# Patient Record
Sex: Female | Born: 1945
Health system: Southern US, Community
[De-identification: ages and names within clinical notes are randomized; demographics above are authoritative.]

## PROBLEM LIST (undated history)

## (undated) DIAGNOSIS — C801 Malignant (primary) neoplasm, unspecified: Secondary | ICD-10-CM

## (undated) DIAGNOSIS — R7303 Prediabetes: Secondary | ICD-10-CM

## (undated) DIAGNOSIS — F419 Anxiety disorder, unspecified: Secondary | ICD-10-CM

## (undated) DIAGNOSIS — E786 Lipoprotein deficiency: Secondary | ICD-10-CM

## (undated) DIAGNOSIS — M1990 Unspecified osteoarthritis, unspecified site: Secondary | ICD-10-CM

## (undated) HISTORY — PX: COLONOSCOPY: SHX174

## (undated) HISTORY — PX: TONSILLECTOMY: SUR1361

---

## 1995-09-17 HISTORY — PX: SHOULDER ARTHROSCOPY: SHX128

## 2001-11-03 ENCOUNTER — Encounter: Admission: RE | Admit: 2001-11-03 | Discharge: 2001-11-03 | Payer: Self-pay | Admitting: Geriatric Medicine

## 2001-11-03 ENCOUNTER — Encounter: Payer: Self-pay | Admitting: Geriatric Medicine

## 2004-01-06 ENCOUNTER — Other Ambulatory Visit: Admission: RE | Admit: 2004-01-06 | Discharge: 2004-01-06 | Payer: Self-pay | Admitting: Geriatric Medicine

## 2006-02-03 ENCOUNTER — Other Ambulatory Visit: Admission: RE | Admit: 2006-02-03 | Discharge: 2006-02-03 | Payer: Self-pay | Admitting: Geriatric Medicine

## 2006-10-23 ENCOUNTER — Encounter: Admission: RE | Admit: 2006-10-23 | Discharge: 2006-10-23 | Payer: Self-pay | Admitting: Orthopedic Surgery

## 2008-02-17 ENCOUNTER — Other Ambulatory Visit: Admission: RE | Admit: 2008-02-17 | Discharge: 2008-02-17 | Payer: Self-pay | Admitting: Geriatric Medicine

## 2009-06-06 ENCOUNTER — Observation Stay (HOSPITAL_COMMUNITY): Admission: EM | Admit: 2009-06-06 | Discharge: 2009-06-07 | Payer: Self-pay | Admitting: Emergency Medicine

## 2010-03-07 ENCOUNTER — Other Ambulatory Visit: Admission: RE | Admit: 2010-03-07 | Discharge: 2010-03-07 | Payer: Self-pay | Admitting: Geriatric Medicine

## 2010-12-21 LAB — CBC
HCT: 36 % (ref 36.0–46.0)
Hemoglobin: 12.2 g/dL (ref 12.0–15.0)
MCHC: 33.6 g/dL (ref 30.0–36.0)
MCHC: 33.9 g/dL (ref 30.0–36.0)
MCHC: 34 g/dL (ref 30.0–36.0)
MCV: 90.5 fL (ref 78.0–100.0)
MCV: 91.2 fL (ref 78.0–100.0)
Platelets: 276 10*3/uL (ref 150–400)
Platelets: 278 10*3/uL (ref 150–400)
Platelets: 283 10*3/uL (ref 150–400)
RBC: 3.98 MIL/uL (ref 3.87–5.11)
RDW: 13.2 % (ref 11.5–15.5)
RDW: 13.4 % (ref 11.5–15.5)
WBC: 7.2 10*3/uL (ref 4.0–10.5)
WBC: 9.2 10*3/uL (ref 4.0–10.5)

## 2010-12-21 LAB — DIFFERENTIAL
Basophils Absolute: 0 10*3/uL (ref 0.0–0.1)
Basophils Relative: 1 % (ref 0–1)
Eosinophils Absolute: 0.2 10*3/uL (ref 0.0–0.7)
Eosinophils Relative: 0 % (ref 0–5)
Eosinophils Relative: 3 % (ref 0–5)
Lymphocytes Relative: 11 % — ABNORMAL LOW (ref 12–46)
Lymphocytes Relative: 29 % (ref 12–46)
Lymphs Abs: 1 10*3/uL (ref 0.7–4.0)
Lymphs Abs: 2.1 10*3/uL (ref 0.7–4.0)
Monocytes Absolute: 0 10*3/uL — ABNORMAL LOW (ref 0.1–1.0)
Monocytes Absolute: 0.7 10*3/uL (ref 0.1–1.0)
Monocytes Relative: 9 % (ref 3–12)
Neutro Abs: 4.2 10*3/uL (ref 1.7–7.7)
Neutrophils Relative %: 58 % (ref 43–77)

## 2010-12-21 LAB — COMPREHENSIVE METABOLIC PANEL
ALT: 17 U/L (ref 0–35)
ALT: 19 U/L (ref 0–35)
AST: 22 U/L (ref 0–37)
AST: 24 U/L (ref 0–37)
Albumin: 3.6 g/dL (ref 3.5–5.2)
Albumin: 3.9 g/dL (ref 3.5–5.2)
Calcium: 8.8 mg/dL (ref 8.4–10.5)
Calcium: 9 mg/dL (ref 8.4–10.5)
Chloride: 109 mEq/L (ref 96–112)
Creatinine, Ser: 0.73 mg/dL (ref 0.4–1.2)
GFR calc Af Amer: >60 mL/min (ref >60)
GFR calc Af Amer: >60 mL/min (ref >60)
Glucose, Bld: 112 mg/dL — ABNORMAL HIGH (ref 70–99)
Potassium: 4.2 mEq/L (ref 3.5–5.1)
Sodium: 138 mEq/L (ref 135–145)
Sodium: 140 mEq/L (ref 135–145)
Total Protein: 6.7 g/dL (ref 6.0–8.3)

## 2010-12-21 LAB — PROTIME-INR
INR: 0.9 (ref 0.00–1.49)
Prothrombin Time: 11.8 seconds (ref 11.6–15.2)

## 2010-12-21 LAB — POCT I-STAT, CHEM 8
Calcium, Ion: 1.14 mmol/L (ref 1.12–1.32)
Creatinine, Ser: 0.7 mg/dL (ref 0.4–1.2)
Glucose, Bld: 112 mg/dL — ABNORMAL HIGH (ref 70–99)
HCT: 37 % (ref 36.0–46.0)
Hemoglobin: 12.6 g/dL (ref 12.0–15.0)
TCO2: 22 mmol/L (ref 0–100)

## 2010-12-21 LAB — DIC (DISSEMINATED INTRAVASCULAR COAGULATION)PANEL
D-Dimer, Quant: 0.85 ug/mL-FEU — ABNORMAL HIGH (ref 0.00–0.48)
Fibrinogen: 390 mg/dL (ref 204–475)
Platelets: 282 10*3/uL (ref 150–400)
aPTT: 27 seconds (ref 24–37)

## 2012-03-13 ENCOUNTER — Other Ambulatory Visit: Payer: Self-pay | Admitting: Geriatric Medicine

## 2012-03-13 ENCOUNTER — Other Ambulatory Visit (HOSPITAL_COMMUNITY)
Admission: RE | Admit: 2012-03-13 | Discharge: 2012-03-13 | Disposition: A | Discharge status: Home / Self Care | Payer: Medicare Other | Source: Ambulatory Visit | Attending: Geriatric Medicine | Admitting: Geriatric Medicine

## 2012-03-13 DIAGNOSIS — Z124 Encounter for screening for malignant neoplasm of cervix: Secondary | ICD-10-CM | POA: Insufficient documentation

## 2012-03-13 DIAGNOSIS — Z79899 Other long term (current) drug therapy: Secondary | ICD-10-CM | POA: Diagnosis not present

## 2012-03-13 DIAGNOSIS — F411 Generalized anxiety disorder: Secondary | ICD-10-CM | POA: Diagnosis not present

## 2012-03-13 DIAGNOSIS — E7800 Pure hypercholesterolemia, unspecified: Secondary | ICD-10-CM | POA: Diagnosis not present

## 2012-03-13 DIAGNOSIS — Z1331 Encounter for screening for depression: Secondary | ICD-10-CM | POA: Diagnosis not present

## 2012-04-03 DIAGNOSIS — Z1231 Encounter for screening mammogram for malignant neoplasm of breast: Secondary | ICD-10-CM | POA: Diagnosis not present

## 2012-07-16 DIAGNOSIS — IMO0002 Reserved for concepts with insufficient information to code with codable children: Secondary | ICD-10-CM | POA: Diagnosis not present

## 2012-07-16 DIAGNOSIS — M23329 Other meniscus derangements, posterior horn of medial meniscus, unspecified knee: Secondary | ICD-10-CM | POA: Diagnosis not present

## 2012-08-05 DIAGNOSIS — M23329 Other meniscus derangements, posterior horn of medial meniscus, unspecified knee: Secondary | ICD-10-CM | POA: Diagnosis not present

## 2012-08-05 DIAGNOSIS — IMO0002 Reserved for concepts with insufficient information to code with codable children: Secondary | ICD-10-CM | POA: Diagnosis not present

## 2012-08-10 DIAGNOSIS — H259 Unspecified age-related cataract: Secondary | ICD-10-CM | POA: Diagnosis not present

## 2012-08-10 DIAGNOSIS — H40249 Residual stage of angle-closure glaucoma, unspecified eye: Secondary | ICD-10-CM | POA: Diagnosis not present

## 2012-08-10 DIAGNOSIS — H43819 Vitreous degeneration, unspecified eye: Secondary | ICD-10-CM | POA: Diagnosis not present

## 2012-08-10 DIAGNOSIS — H524 Presbyopia: Secondary | ICD-10-CM | POA: Diagnosis not present

## 2013-03-26 DIAGNOSIS — Z1331 Encounter for screening for depression: Secondary | ICD-10-CM | POA: Diagnosis not present

## 2013-03-26 DIAGNOSIS — Z0000 Encounter for general adult medical examination without abnormal findings: Secondary | ICD-10-CM | POA: Diagnosis not present

## 2013-03-26 DIAGNOSIS — E7800 Pure hypercholesterolemia, unspecified: Secondary | ICD-10-CM | POA: Diagnosis not present

## 2013-03-26 DIAGNOSIS — N951 Menopausal and female climacteric states: Secondary | ICD-10-CM | POA: Diagnosis not present

## 2013-03-26 DIAGNOSIS — R071 Chest pain on breathing: Secondary | ICD-10-CM | POA: Diagnosis not present

## 2013-03-26 DIAGNOSIS — Z79899 Other long term (current) drug therapy: Secondary | ICD-10-CM | POA: Diagnosis not present

## 2013-04-12 DIAGNOSIS — Z1231 Encounter for screening mammogram for malignant neoplasm of breast: Secondary | ICD-10-CM | POA: Diagnosis not present

## 2013-04-28 DIAGNOSIS — M899 Disorder of bone, unspecified: Secondary | ICD-10-CM | POA: Diagnosis not present

## 2013-04-28 DIAGNOSIS — N951 Menopausal and female climacteric states: Secondary | ICD-10-CM | POA: Diagnosis not present

## 2013-05-04 DIAGNOSIS — F411 Generalized anxiety disorder: Secondary | ICD-10-CM | POA: Diagnosis not present

## 2013-05-04 DIAGNOSIS — R071 Chest pain on breathing: Secondary | ICD-10-CM | POA: Diagnosis not present

## 2013-05-04 DIAGNOSIS — M899 Disorder of bone, unspecified: Secondary | ICD-10-CM | POA: Diagnosis not present

## 2013-06-11 DIAGNOSIS — B079 Viral wart, unspecified: Secondary | ICD-10-CM | POA: Diagnosis not present

## 2013-06-11 DIAGNOSIS — L909 Atrophic disorder of skin, unspecified: Secondary | ICD-10-CM | POA: Diagnosis not present

## 2013-06-11 DIAGNOSIS — L821 Other seborrheic keratosis: Secondary | ICD-10-CM | POA: Diagnosis not present

## 2013-08-27 DIAGNOSIS — H43819 Vitreous degeneration, unspecified eye: Secondary | ICD-10-CM | POA: Diagnosis not present

## 2013-08-27 DIAGNOSIS — H40249 Residual stage of angle-closure glaucoma, unspecified eye: Secondary | ICD-10-CM | POA: Diagnosis not present

## 2013-08-27 DIAGNOSIS — H524 Presbyopia: Secondary | ICD-10-CM | POA: Diagnosis not present

## 2013-08-27 DIAGNOSIS — H25019 Cortical age-related cataract, unspecified eye: Secondary | ICD-10-CM | POA: Diagnosis not present

## 2014-04-01 DIAGNOSIS — R21 Rash and other nonspecific skin eruption: Secondary | ICD-10-CM | POA: Diagnosis not present

## 2014-04-01 DIAGNOSIS — Z0000 Encounter for general adult medical examination without abnormal findings: Secondary | ICD-10-CM | POA: Diagnosis not present

## 2014-04-01 DIAGNOSIS — Z79899 Other long term (current) drug therapy: Secondary | ICD-10-CM | POA: Diagnosis not present

## 2014-04-01 DIAGNOSIS — Z1331 Encounter for screening for depression: Secondary | ICD-10-CM | POA: Diagnosis not present

## 2014-04-01 DIAGNOSIS — M25559 Pain in unspecified hip: Secondary | ICD-10-CM | POA: Diagnosis not present

## 2014-04-01 DIAGNOSIS — F411 Generalized anxiety disorder: Secondary | ICD-10-CM | POA: Diagnosis not present

## 2014-04-01 DIAGNOSIS — Z23 Encounter for immunization: Secondary | ICD-10-CM | POA: Diagnosis not present

## 2014-04-01 DIAGNOSIS — E7800 Pure hypercholesterolemia, unspecified: Secondary | ICD-10-CM | POA: Diagnosis not present

## 2014-04-15 DIAGNOSIS — Z1231 Encounter for screening mammogram for malignant neoplasm of breast: Secondary | ICD-10-CM | POA: Diagnosis not present

## 2014-06-13 DIAGNOSIS — C44319 Basal cell carcinoma of skin of other parts of face: Secondary | ICD-10-CM | POA: Diagnosis not present

## 2014-06-13 DIAGNOSIS — L819 Disorder of pigmentation, unspecified: Secondary | ICD-10-CM | POA: Diagnosis not present

## 2014-06-13 DIAGNOSIS — L821 Other seborrheic keratosis: Secondary | ICD-10-CM | POA: Diagnosis not present

## 2014-06-22 DIAGNOSIS — C44319 Basal cell carcinoma of skin of other parts of face: Secondary | ICD-10-CM | POA: Diagnosis not present

## 2014-07-04 DIAGNOSIS — Z79899 Other long term (current) drug therapy: Secondary | ICD-10-CM | POA: Diagnosis not present

## 2014-07-04 DIAGNOSIS — E780 Pure hypercholesterolemia: Secondary | ICD-10-CM | POA: Diagnosis not present

## 2014-09-01 DIAGNOSIS — H52203 Unspecified astigmatism, bilateral: Secondary | ICD-10-CM | POA: Diagnosis not present

## 2014-09-01 DIAGNOSIS — H04123 Dry eye syndrome of bilateral lacrimal glands: Secondary | ICD-10-CM | POA: Diagnosis not present

## 2014-09-01 DIAGNOSIS — H43813 Vitreous degeneration, bilateral: Secondary | ICD-10-CM | POA: Diagnosis not present

## 2014-09-01 DIAGNOSIS — H2513 Age-related nuclear cataract, bilateral: Secondary | ICD-10-CM | POA: Diagnosis not present

## 2014-11-28 DIAGNOSIS — M25561 Pain in right knee: Secondary | ICD-10-CM | POA: Diagnosis not present

## 2014-12-01 DIAGNOSIS — M79651 Pain in right thigh: Secondary | ICD-10-CM | POA: Diagnosis not present

## 2014-12-01 DIAGNOSIS — M25661 Stiffness of right knee, not elsewhere classified: Secondary | ICD-10-CM | POA: Diagnosis not present

## 2014-12-01 DIAGNOSIS — M25561 Pain in right knee: Secondary | ICD-10-CM | POA: Diagnosis not present

## 2014-12-01 DIAGNOSIS — M25461 Effusion, right knee: Secondary | ICD-10-CM | POA: Diagnosis not present

## 2014-12-06 DIAGNOSIS — M25561 Pain in right knee: Secondary | ICD-10-CM | POA: Diagnosis not present

## 2014-12-06 DIAGNOSIS — M79651 Pain in right thigh: Secondary | ICD-10-CM | POA: Diagnosis not present

## 2014-12-06 DIAGNOSIS — M25461 Effusion, right knee: Secondary | ICD-10-CM | POA: Diagnosis not present

## 2014-12-06 DIAGNOSIS — M25661 Stiffness of right knee, not elsewhere classified: Secondary | ICD-10-CM | POA: Diagnosis not present

## 2014-12-08 DIAGNOSIS — M25461 Effusion, right knee: Secondary | ICD-10-CM | POA: Diagnosis not present

## 2014-12-08 DIAGNOSIS — M79651 Pain in right thigh: Secondary | ICD-10-CM | POA: Diagnosis not present

## 2014-12-08 DIAGNOSIS — M25561 Pain in right knee: Secondary | ICD-10-CM | POA: Diagnosis not present

## 2014-12-08 DIAGNOSIS — M25661 Stiffness of right knee, not elsewhere classified: Secondary | ICD-10-CM | POA: Diagnosis not present

## 2014-12-09 DIAGNOSIS — M79651 Pain in right thigh: Secondary | ICD-10-CM | POA: Diagnosis not present

## 2014-12-09 DIAGNOSIS — M25661 Stiffness of right knee, not elsewhere classified: Secondary | ICD-10-CM | POA: Diagnosis not present

## 2014-12-09 DIAGNOSIS — M25561 Pain in right knee: Secondary | ICD-10-CM | POA: Diagnosis not present

## 2014-12-09 DIAGNOSIS — M25461 Effusion, right knee: Secondary | ICD-10-CM | POA: Diagnosis not present

## 2014-12-16 DIAGNOSIS — M25561 Pain in right knee: Secondary | ICD-10-CM | POA: Diagnosis not present

## 2014-12-16 DIAGNOSIS — M25461 Effusion, right knee: Secondary | ICD-10-CM | POA: Diagnosis not present

## 2014-12-16 DIAGNOSIS — M79651 Pain in right thigh: Secondary | ICD-10-CM | POA: Diagnosis not present

## 2014-12-16 DIAGNOSIS — M25661 Stiffness of right knee, not elsewhere classified: Secondary | ICD-10-CM | POA: Diagnosis not present

## 2014-12-20 DIAGNOSIS — M79651 Pain in right thigh: Secondary | ICD-10-CM | POA: Diagnosis not present

## 2014-12-20 DIAGNOSIS — M25561 Pain in right knee: Secondary | ICD-10-CM | POA: Diagnosis not present

## 2014-12-20 DIAGNOSIS — M25461 Effusion, right knee: Secondary | ICD-10-CM | POA: Diagnosis not present

## 2014-12-20 DIAGNOSIS — M25661 Stiffness of right knee, not elsewhere classified: Secondary | ICD-10-CM | POA: Diagnosis not present

## 2014-12-22 DIAGNOSIS — M79651 Pain in right thigh: Secondary | ICD-10-CM | POA: Diagnosis not present

## 2014-12-22 DIAGNOSIS — M25561 Pain in right knee: Secondary | ICD-10-CM | POA: Diagnosis not present

## 2014-12-22 DIAGNOSIS — M25461 Effusion, right knee: Secondary | ICD-10-CM | POA: Diagnosis not present

## 2014-12-22 DIAGNOSIS — M25661 Stiffness of right knee, not elsewhere classified: Secondary | ICD-10-CM | POA: Diagnosis not present

## 2014-12-23 DIAGNOSIS — M25561 Pain in right knee: Secondary | ICD-10-CM | POA: Diagnosis not present

## 2014-12-23 DIAGNOSIS — M79651 Pain in right thigh: Secondary | ICD-10-CM | POA: Diagnosis not present

## 2014-12-23 DIAGNOSIS — M25661 Stiffness of right knee, not elsewhere classified: Secondary | ICD-10-CM | POA: Diagnosis not present

## 2014-12-23 DIAGNOSIS — M25461 Effusion, right knee: Secondary | ICD-10-CM | POA: Diagnosis not present

## 2014-12-27 DIAGNOSIS — M25461 Effusion, right knee: Secondary | ICD-10-CM | POA: Diagnosis not present

## 2014-12-27 DIAGNOSIS — M79651 Pain in right thigh: Secondary | ICD-10-CM | POA: Diagnosis not present

## 2014-12-27 DIAGNOSIS — M25661 Stiffness of right knee, not elsewhere classified: Secondary | ICD-10-CM | POA: Diagnosis not present

## 2014-12-27 DIAGNOSIS — M25561 Pain in right knee: Secondary | ICD-10-CM | POA: Diagnosis not present

## 2014-12-29 DIAGNOSIS — M25461 Effusion, right knee: Secondary | ICD-10-CM | POA: Diagnosis not present

## 2014-12-29 DIAGNOSIS — M25661 Stiffness of right knee, not elsewhere classified: Secondary | ICD-10-CM | POA: Diagnosis not present

## 2014-12-29 DIAGNOSIS — M79651 Pain in right thigh: Secondary | ICD-10-CM | POA: Diagnosis not present

## 2014-12-29 DIAGNOSIS — M25561 Pain in right knee: Secondary | ICD-10-CM | POA: Diagnosis not present

## 2014-12-30 DIAGNOSIS — M25661 Stiffness of right knee, not elsewhere classified: Secondary | ICD-10-CM | POA: Diagnosis not present

## 2014-12-30 DIAGNOSIS — M79651 Pain in right thigh: Secondary | ICD-10-CM | POA: Diagnosis not present

## 2014-12-30 DIAGNOSIS — M25561 Pain in right knee: Secondary | ICD-10-CM | POA: Diagnosis not present

## 2014-12-30 DIAGNOSIS — M25461 Effusion, right knee: Secondary | ICD-10-CM | POA: Diagnosis not present

## 2015-03-13 DIAGNOSIS — M25561 Pain in right knee: Secondary | ICD-10-CM | POA: Diagnosis not present

## 2015-04-03 DIAGNOSIS — Z1389 Encounter for screening for other disorder: Secondary | ICD-10-CM | POA: Diagnosis not present

## 2015-04-03 DIAGNOSIS — M8580 Other specified disorders of bone density and structure, unspecified site: Secondary | ICD-10-CM | POA: Diagnosis not present

## 2015-04-03 DIAGNOSIS — Z0000 Encounter for general adult medical examination without abnormal findings: Secondary | ICD-10-CM | POA: Diagnosis not present

## 2015-04-03 DIAGNOSIS — E559 Vitamin D deficiency, unspecified: Secondary | ICD-10-CM | POA: Diagnosis not present

## 2015-04-03 DIAGNOSIS — E780 Pure hypercholesterolemia: Secondary | ICD-10-CM | POA: Diagnosis not present

## 2015-04-03 DIAGNOSIS — Z79899 Other long term (current) drug therapy: Secondary | ICD-10-CM | POA: Diagnosis not present

## 2015-04-03 DIAGNOSIS — F410 Panic disorder [episodic paroxysmal anxiety] without agoraphobia: Secondary | ICD-10-CM | POA: Diagnosis not present

## 2015-04-14 DIAGNOSIS — H25813 Combined forms of age-related cataract, bilateral: Secondary | ICD-10-CM | POA: Diagnosis not present

## 2015-04-14 DIAGNOSIS — H40243 Residual stage of angle-closure glaucoma, bilateral: Secondary | ICD-10-CM | POA: Diagnosis not present

## 2015-04-14 DIAGNOSIS — H524 Presbyopia: Secondary | ICD-10-CM | POA: Diagnosis not present

## 2015-04-14 DIAGNOSIS — H04123 Dry eye syndrome of bilateral lacrimal glands: Secondary | ICD-10-CM | POA: Diagnosis not present

## 2015-04-17 DIAGNOSIS — Z1231 Encounter for screening mammogram for malignant neoplasm of breast: Secondary | ICD-10-CM | POA: Diagnosis not present

## 2015-06-15 DIAGNOSIS — L820 Inflamed seborrheic keratosis: Secondary | ICD-10-CM | POA: Diagnosis not present

## 2015-06-15 DIAGNOSIS — D2239 Melanocytic nevi of other parts of face: Secondary | ICD-10-CM | POA: Diagnosis not present

## 2015-06-15 DIAGNOSIS — Z85828 Personal history of other malignant neoplasm of skin: Secondary | ICD-10-CM | POA: Diagnosis not present

## 2015-06-15 DIAGNOSIS — L814 Other melanin hyperpigmentation: Secondary | ICD-10-CM | POA: Diagnosis not present

## 2015-06-15 DIAGNOSIS — L821 Other seborrheic keratosis: Secondary | ICD-10-CM | POA: Diagnosis not present

## 2015-10-20 DIAGNOSIS — H43813 Vitreous degeneration, bilateral: Secondary | ICD-10-CM | POA: Diagnosis not present

## 2015-10-20 DIAGNOSIS — H52203 Unspecified astigmatism, bilateral: Secondary | ICD-10-CM | POA: Diagnosis not present

## 2015-10-20 DIAGNOSIS — H40243 Residual stage of angle-closure glaucoma, bilateral: Secondary | ICD-10-CM | POA: Diagnosis not present

## 2015-10-20 DIAGNOSIS — H25813 Combined forms of age-related cataract, bilateral: Secondary | ICD-10-CM | POA: Diagnosis not present

## 2015-10-31 DIAGNOSIS — H25032 Anterior subcapsular polar age-related cataract, left eye: Secondary | ICD-10-CM | POA: Diagnosis not present

## 2015-10-31 DIAGNOSIS — H25042 Posterior subcapsular polar age-related cataract, left eye: Secondary | ICD-10-CM | POA: Diagnosis not present

## 2015-10-31 DIAGNOSIS — H25812 Combined forms of age-related cataract, left eye: Secondary | ICD-10-CM | POA: Diagnosis not present

## 2015-10-31 DIAGNOSIS — H2512 Age-related nuclear cataract, left eye: Secondary | ICD-10-CM | POA: Diagnosis not present

## 2015-11-14 DIAGNOSIS — H25811 Combined forms of age-related cataract, right eye: Secondary | ICD-10-CM | POA: Diagnosis not present

## 2015-11-14 DIAGNOSIS — H2511 Age-related nuclear cataract, right eye: Secondary | ICD-10-CM | POA: Diagnosis not present

## 2015-11-14 DIAGNOSIS — H52201 Unspecified astigmatism, right eye: Secondary | ICD-10-CM | POA: Diagnosis not present

## 2015-11-14 DIAGNOSIS — H25031 Anterior subcapsular polar age-related cataract, right eye: Secondary | ICD-10-CM | POA: Diagnosis not present

## 2016-04-09 DIAGNOSIS — M85862 Other specified disorders of bone density and structure, left lower leg: Secondary | ICD-10-CM | POA: Diagnosis not present

## 2016-04-09 DIAGNOSIS — Z1389 Encounter for screening for other disorder: Secondary | ICD-10-CM | POA: Diagnosis not present

## 2016-04-09 DIAGNOSIS — M79644 Pain in right finger(s): Secondary | ICD-10-CM | POA: Diagnosis not present

## 2016-04-09 DIAGNOSIS — M85861 Other specified disorders of bone density and structure, right lower leg: Secondary | ICD-10-CM | POA: Diagnosis not present

## 2016-04-09 DIAGNOSIS — M79645 Pain in left finger(s): Secondary | ICD-10-CM | POA: Diagnosis not present

## 2016-04-09 DIAGNOSIS — E7800 Pure hypercholesterolemia, unspecified: Secondary | ICD-10-CM | POA: Diagnosis not present

## 2016-04-09 DIAGNOSIS — F410 Panic disorder [episodic paroxysmal anxiety] without agoraphobia: Secondary | ICD-10-CM | POA: Diagnosis not present

## 2016-04-09 DIAGNOSIS — E559 Vitamin D deficiency, unspecified: Secondary | ICD-10-CM | POA: Diagnosis not present

## 2016-04-09 DIAGNOSIS — Z0000 Encounter for general adult medical examination without abnormal findings: Secondary | ICD-10-CM | POA: Diagnosis not present

## 2016-04-09 DIAGNOSIS — Z79899 Other long term (current) drug therapy: Secondary | ICD-10-CM | POA: Diagnosis not present

## 2016-04-10 DIAGNOSIS — M85861 Other specified disorders of bone density and structure, right lower leg: Secondary | ICD-10-CM | POA: Diagnosis not present

## 2016-04-10 DIAGNOSIS — M8588 Other specified disorders of bone density and structure, other site: Secondary | ICD-10-CM | POA: Diagnosis not present

## 2016-06-12 DIAGNOSIS — L821 Other seborrheic keratosis: Secondary | ICD-10-CM | POA: Diagnosis not present

## 2016-06-12 DIAGNOSIS — D225 Melanocytic nevi of trunk: Secondary | ICD-10-CM | POA: Diagnosis not present

## 2016-06-12 DIAGNOSIS — L438 Other lichen planus: Secondary | ICD-10-CM | POA: Diagnosis not present

## 2016-06-12 DIAGNOSIS — D2262 Melanocytic nevi of left upper limb, including shoulder: Secondary | ICD-10-CM | POA: Diagnosis not present

## 2016-06-12 DIAGNOSIS — L720 Epidermal cyst: Secondary | ICD-10-CM | POA: Diagnosis not present

## 2016-06-12 DIAGNOSIS — D2261 Melanocytic nevi of right upper limb, including shoulder: Secondary | ICD-10-CM | POA: Diagnosis not present

## 2016-06-12 DIAGNOSIS — Z85828 Personal history of other malignant neoplasm of skin: Secondary | ICD-10-CM | POA: Diagnosis not present

## 2016-06-12 DIAGNOSIS — L814 Other melanin hyperpigmentation: Secondary | ICD-10-CM | POA: Diagnosis not present

## 2016-07-15 DIAGNOSIS — K640 First degree hemorrhoids: Secondary | ICD-10-CM | POA: Diagnosis not present

## 2016-07-15 DIAGNOSIS — D126 Benign neoplasm of colon, unspecified: Secondary | ICD-10-CM | POA: Diagnosis not present

## 2016-07-15 DIAGNOSIS — D124 Benign neoplasm of descending colon: Secondary | ICD-10-CM | POA: Diagnosis not present

## 2016-07-15 DIAGNOSIS — Z1211 Encounter for screening for malignant neoplasm of colon: Secondary | ICD-10-CM | POA: Diagnosis not present

## 2016-07-18 DIAGNOSIS — D126 Benign neoplasm of colon, unspecified: Secondary | ICD-10-CM | POA: Diagnosis not present

## 2016-07-18 DIAGNOSIS — Z1211 Encounter for screening for malignant neoplasm of colon: Secondary | ICD-10-CM | POA: Diagnosis not present

## 2016-09-16 HISTORY — PX: EYE SURGERY: SHX253

## 2017-02-07 DIAGNOSIS — H04123 Dry eye syndrome of bilateral lacrimal glands: Secondary | ICD-10-CM | POA: Diagnosis not present

## 2017-02-07 DIAGNOSIS — H26493 Other secondary cataract, bilateral: Secondary | ICD-10-CM | POA: Diagnosis not present

## 2017-02-07 DIAGNOSIS — Z961 Presence of intraocular lens: Secondary | ICD-10-CM | POA: Diagnosis not present

## 2017-02-07 DIAGNOSIS — H43813 Vitreous degeneration, bilateral: Secondary | ICD-10-CM | POA: Diagnosis not present

## 2017-03-31 DIAGNOSIS — Z1231 Encounter for screening mammogram for malignant neoplasm of breast: Secondary | ICD-10-CM | POA: Diagnosis not present

## 2017-04-28 DIAGNOSIS — Z79899 Other long term (current) drug therapy: Secondary | ICD-10-CM | POA: Diagnosis not present

## 2017-04-28 DIAGNOSIS — E559 Vitamin D deficiency, unspecified: Secondary | ICD-10-CM | POA: Diagnosis not present

## 2017-04-28 DIAGNOSIS — F419 Anxiety disorder, unspecified: Secondary | ICD-10-CM | POA: Diagnosis not present

## 2017-04-28 DIAGNOSIS — R197 Diarrhea, unspecified: Secondary | ICD-10-CM | POA: Diagnosis not present

## 2017-04-28 DIAGNOSIS — Z0000 Encounter for general adult medical examination without abnormal findings: Secondary | ICD-10-CM | POA: Diagnosis not present

## 2017-04-28 DIAGNOSIS — E7800 Pure hypercholesterolemia, unspecified: Secondary | ICD-10-CM | POA: Diagnosis not present

## 2017-04-28 DIAGNOSIS — M8580 Other specified disorders of bone density and structure, unspecified site: Secondary | ICD-10-CM | POA: Diagnosis not present

## 2017-04-28 DIAGNOSIS — Z1389 Encounter for screening for other disorder: Secondary | ICD-10-CM | POA: Diagnosis not present

## 2017-06-16 DIAGNOSIS — Z85828 Personal history of other malignant neoplasm of skin: Secondary | ICD-10-CM | POA: Diagnosis not present

## 2017-06-16 DIAGNOSIS — D485 Neoplasm of uncertain behavior of skin: Secondary | ICD-10-CM | POA: Diagnosis not present

## 2017-06-16 DIAGNOSIS — D692 Other nonthrombocytopenic purpura: Secondary | ICD-10-CM | POA: Diagnosis not present

## 2017-06-16 DIAGNOSIS — L821 Other seborrheic keratosis: Secondary | ICD-10-CM | POA: Diagnosis not present

## 2017-06-16 DIAGNOSIS — D1801 Hemangioma of skin and subcutaneous tissue: Secondary | ICD-10-CM | POA: Diagnosis not present

## 2017-06-16 DIAGNOSIS — L814 Other melanin hyperpigmentation: Secondary | ICD-10-CM | POA: Diagnosis not present

## 2017-06-16 DIAGNOSIS — D225 Melanocytic nevi of trunk: Secondary | ICD-10-CM | POA: Diagnosis not present

## 2017-06-25 DIAGNOSIS — Z23 Encounter for immunization: Secondary | ICD-10-CM | POA: Diagnosis not present

## 2017-10-24 ENCOUNTER — Other Ambulatory Visit: Payer: Self-pay | Admitting: Geriatric Medicine

## 2017-10-24 DIAGNOSIS — R109 Unspecified abdominal pain: Secondary | ICD-10-CM

## 2017-10-24 DIAGNOSIS — R1084 Generalized abdominal pain: Secondary | ICD-10-CM | POA: Diagnosis not present

## 2017-11-01 ENCOUNTER — Ambulatory Visit
Admission: RE | Admit: 2017-11-01 | Discharge: 2017-11-01 | Disposition: A | Discharge status: Home / Self Care | Payer: Medicare Other | Source: Ambulatory Visit | Attending: Geriatric Medicine | Admitting: Geriatric Medicine

## 2017-11-01 DIAGNOSIS — K4090 Unilateral inguinal hernia, without obstruction or gangrene, not specified as recurrent: Secondary | ICD-10-CM | POA: Diagnosis not present

## 2017-11-01 DIAGNOSIS — R109 Unspecified abdominal pain: Secondary | ICD-10-CM

## 2017-11-01 MED ORDER — IOPAMIDOL (ISOVUE-300) INJECTION 61%
100.0000 mL | Freq: Once | INTRAVENOUS | Status: AC | PRN
  Administered 2017-11-01: 100 mL via INTRAVENOUS

## 2018-02-12 DIAGNOSIS — Z961 Presence of intraocular lens: Secondary | ICD-10-CM | POA: Diagnosis not present

## 2018-02-12 DIAGNOSIS — H52202 Unspecified astigmatism, left eye: Secondary | ICD-10-CM | POA: Diagnosis not present

## 2018-02-12 DIAGNOSIS — H43813 Vitreous degeneration, bilateral: Secondary | ICD-10-CM | POA: Diagnosis not present

## 2018-02-12 DIAGNOSIS — H04123 Dry eye syndrome of bilateral lacrimal glands: Secondary | ICD-10-CM | POA: Diagnosis not present

## 2018-03-04 DIAGNOSIS — S90851A Superficial foreign body, right foot, initial encounter: Secondary | ICD-10-CM | POA: Diagnosis not present

## 2018-05-01 DIAGNOSIS — E7800 Pure hypercholesterolemia, unspecified: Secondary | ICD-10-CM | POA: Diagnosis not present

## 2018-05-01 DIAGNOSIS — Z1389 Encounter for screening for other disorder: Secondary | ICD-10-CM | POA: Diagnosis not present

## 2018-05-01 DIAGNOSIS — F419 Anxiety disorder, unspecified: Secondary | ICD-10-CM | POA: Diagnosis not present

## 2018-05-01 DIAGNOSIS — Z79899 Other long term (current) drug therapy: Secondary | ICD-10-CM | POA: Diagnosis not present

## 2018-05-01 DIAGNOSIS — R195 Other fecal abnormalities: Secondary | ICD-10-CM | POA: Diagnosis not present

## 2018-05-01 DIAGNOSIS — Z0000 Encounter for general adult medical examination without abnormal findings: Secondary | ICD-10-CM | POA: Diagnosis not present

## 2018-06-17 DIAGNOSIS — D225 Melanocytic nevi of trunk: Secondary | ICD-10-CM | POA: Diagnosis not present

## 2018-06-17 DIAGNOSIS — L570 Actinic keratosis: Secondary | ICD-10-CM | POA: Diagnosis not present

## 2018-06-17 DIAGNOSIS — Z85828 Personal history of other malignant neoplasm of skin: Secondary | ICD-10-CM | POA: Diagnosis not present

## 2018-06-17 DIAGNOSIS — L814 Other melanin hyperpigmentation: Secondary | ICD-10-CM | POA: Diagnosis not present

## 2018-06-17 DIAGNOSIS — L720 Epidermal cyst: Secondary | ICD-10-CM | POA: Diagnosis not present

## 2018-06-17 DIAGNOSIS — L821 Other seborrheic keratosis: Secondary | ICD-10-CM | POA: Diagnosis not present

## 2018-07-03 DIAGNOSIS — Z23 Encounter for immunization: Secondary | ICD-10-CM | POA: Diagnosis not present

## 2018-07-07 DIAGNOSIS — Z1231 Encounter for screening mammogram for malignant neoplasm of breast: Secondary | ICD-10-CM | POA: Diagnosis not present

## 2018-07-27 DIAGNOSIS — L570 Actinic keratosis: Secondary | ICD-10-CM | POA: Diagnosis not present

## 2018-07-27 DIAGNOSIS — L578 Other skin changes due to chronic exposure to nonionizing radiation: Secondary | ICD-10-CM | POA: Diagnosis not present

## 2018-07-27 DIAGNOSIS — D485 Neoplasm of uncertain behavior of skin: Secondary | ICD-10-CM | POA: Diagnosis not present

## 2018-11-05 DIAGNOSIS — Z961 Presence of intraocular lens: Secondary | ICD-10-CM | POA: Diagnosis not present

## 2018-11-05 DIAGNOSIS — H26491 Other secondary cataract, right eye: Secondary | ICD-10-CM | POA: Diagnosis not present

## 2018-11-05 DIAGNOSIS — H43811 Vitreous degeneration, right eye: Secondary | ICD-10-CM | POA: Diagnosis not present

## 2019-01-28 DIAGNOSIS — H26491 Other secondary cataract, right eye: Secondary | ICD-10-CM | POA: Diagnosis not present

## 2019-04-01 DIAGNOSIS — H26492 Other secondary cataract, left eye: Secondary | ICD-10-CM | POA: Diagnosis not present

## 2019-05-04 DIAGNOSIS — Z79899 Other long term (current) drug therapy: Secondary | ICD-10-CM | POA: Diagnosis not present

## 2019-05-04 DIAGNOSIS — Z1331 Encounter for screening for depression: Secondary | ICD-10-CM | POA: Diagnosis not present

## 2019-05-04 DIAGNOSIS — F4001 Agoraphobia with panic disorder: Secondary | ICD-10-CM | POA: Diagnosis not present

## 2019-05-04 DIAGNOSIS — Z23 Encounter for immunization: Secondary | ICD-10-CM | POA: Diagnosis not present

## 2019-05-04 DIAGNOSIS — F410 Panic disorder [episodic paroxysmal anxiety] without agoraphobia: Secondary | ICD-10-CM | POA: Diagnosis not present

## 2019-05-04 DIAGNOSIS — E559 Vitamin D deficiency, unspecified: Secondary | ICD-10-CM | POA: Diagnosis not present

## 2019-05-04 DIAGNOSIS — E7800 Pure hypercholesterolemia, unspecified: Secondary | ICD-10-CM | POA: Diagnosis not present

## 2019-05-04 DIAGNOSIS — Z0000 Encounter for general adult medical examination without abnormal findings: Secondary | ICD-10-CM | POA: Diagnosis not present

## 2019-05-18 DIAGNOSIS — R6883 Chills (without fever): Secondary | ICD-10-CM | POA: Diagnosis not present

## 2019-06-01 ENCOUNTER — Ambulatory Visit (INDEPENDENT_AMBULATORY_CARE_PROVIDER_SITE_OTHER): Payer: Medicare Other | Admitting: Sports Medicine

## 2019-06-01 ENCOUNTER — Other Ambulatory Visit: Payer: Self-pay

## 2019-06-01 ENCOUNTER — Ambulatory Visit
Admission: RE | Admit: 2019-06-01 | Discharge: 2019-06-01 | Disposition: A | Discharge status: Home / Self Care | Payer: Medicare Other | Source: Ambulatory Visit | Attending: Sports Medicine | Admitting: Sports Medicine

## 2019-06-01 VITALS — BP 136/62 | Ht 64.5 in | Wt 144.0 lb

## 2019-06-01 DIAGNOSIS — M25562 Pain in left knee: Secondary | ICD-10-CM

## 2019-06-01 DIAGNOSIS — M1712 Unilateral primary osteoarthritis, left knee: Secondary | ICD-10-CM | POA: Diagnosis not present

## 2019-06-01 NOTE — Patient Instructions (Signed)
It was a pleasure meeting y'all today. Below are the instructions we discussed:  - We have ordered X-ray for your left knee to obtain at the imaging center - Follow-up in 1 month or sooner if needed - You can get capsicin cream over-the-counter and use for your thumbs. Be sure to wash hands after use! - Ice your knee for 10-15 minutes after walks or exercise - Use a compression sleeve while exercising - Do quad exercises as instructed by our trainer

## 2019-06-01 NOTE — Progress Notes (Signed)
Subjective:  Chief complaint: Left knee pain  Miss "Nichole Newcomer" [name] is a very pleasant 73 year old female presenting with 2 weeks of left knee pain. Pain is located on inside of knee, not in front or on outside. Describes as sharp pain. It came on suddenly while she was sleeping one night. The pain has now become more of a dull ache after 10 days of prescribed meloxicam by her PCP, which she stopped 2 days ago. She has used advil about once or twice per day since and that helps. Pain is worse first thing in the morning and when sitting or lying flat. Walking improves pain. She cannot think of any trauma or inciting event. She has had similar pain in right knee that started ~5 years ago and diagnosed with arthritis. She does yoga everyday and water aerobics about 2x per week. They also walk up to 4 miles/day. She also has arthritis of thumbs.   PMHx: Reviewed PSHx: Reviewed Meds: Reviewed  NKDA       Objective:   Well-developed, well-nourished.  No acute distress.  Awake alert and oriented x3.  Vital signs reviewed.  Left knee: No obvious edema, erythema or deformity on inspection. Pain to palpation over medial aspect along joint line. FROM, active and passive. Pain elicited when knee at full extension. Strength 5/5 with flexion and extension of leg. Negative Lachman, Thesally, varus and valgus testing.   Right knee: No obvious edema, erythema or deformity on inspection. Pain to palpation over medial aspect along joint line. FROM, active and passive. Strength 5/5 with flexion and extension of leg. Negative Lachman, varus and valgus testing.      Assessment & Plan.    L Knee Osteoarthritis. Less likely medial meniscal tear.   Will obtain x-rays of left knee (AP, lateral, sun-rise). Recommended quad exercises, compression sleeve while exercising, icing after exercise and follow-up in 1 month.   Patient seen and evaluated with the resident.  I agree with the above plan of care.  Patient's x-rays  show only mild medial compartmental DJD.  She likely has a degenerative medial meniscal tear.  Treatment as above and follow-up with me in 1 month.  If symptoms persist consider merits of cortisone injection versus further diagnostic imaging.  Patient is encouraged to call with questions or concerns in the interim.

## 2019-06-02 ENCOUNTER — Encounter: Payer: Self-pay | Admitting: Sports Medicine

## 2019-06-23 DIAGNOSIS — D225 Melanocytic nevi of trunk: Secondary | ICD-10-CM | POA: Diagnosis not present

## 2019-06-23 DIAGNOSIS — L821 Other seborrheic keratosis: Secondary | ICD-10-CM | POA: Diagnosis not present

## 2019-06-23 DIAGNOSIS — Z85828 Personal history of other malignant neoplasm of skin: Secondary | ICD-10-CM | POA: Diagnosis not present

## 2019-06-23 DIAGNOSIS — L814 Other melanin hyperpigmentation: Secondary | ICD-10-CM | POA: Diagnosis not present

## 2019-07-06 ENCOUNTER — Other Ambulatory Visit: Payer: Self-pay

## 2019-07-06 ENCOUNTER — Ambulatory Visit (INDEPENDENT_AMBULATORY_CARE_PROVIDER_SITE_OTHER): Payer: Medicare Other | Admitting: Sports Medicine

## 2019-07-06 VITALS — BP 126/72 | Ht 64.5 in | Wt 145.0 lb

## 2019-07-06 DIAGNOSIS — M25562 Pain in left knee: Secondary | ICD-10-CM | POA: Diagnosis not present

## 2019-07-07 NOTE — Progress Notes (Signed)
   Subjective:    Patient ID: Nichole [name], female    DOB: 20-May-1946, 73 y.o.   MRN: [id number]  HPI   Patient comes in today for follow-up on left knee pain.  Overall, she is doing well.  Pain is improved.  She has now trying to work on Dietitian.  She has returned to the gym on a limited basis.  Is starting to do leg extensions but has noticed that both legs are quite weak.  Otherwise she is doing well.  Review of Systems    As above Objective:   Physical Exam  Well-developed, well-nourished.  No acute distress.  Awake alert and oriented x3.  Vital signs reviewed  Left knee: Full range of motion.  No effusion.  No tenderness to palpation.  Negative McMurray's.  Good stability.  Moderate quad weakness.  Neurovascularly intact distally.  Walking without a limp.     Assessment & Plan:   Improving left knee pain secondary to DJD  Patient will continue to work on quadriceps strengthening.  She has also found a combination of ibuprofen and Tylenol to be helpful.  She will continue this on an as-needed basis.  Resume activity as tolerated and follow-up with me for ongoing or recalcitrant issues.

## 2019-07-28 DIAGNOSIS — H903 Sensorineural hearing loss, bilateral: Secondary | ICD-10-CM | POA: Diagnosis not present

## 2019-07-28 DIAGNOSIS — M2669 Other specified disorders of temporomandibular joint: Secondary | ICD-10-CM | POA: Insufficient documentation

## 2019-09-29 DIAGNOSIS — Z23 Encounter for immunization: Secondary | ICD-10-CM | POA: Diagnosis not present

## 2019-10-27 DIAGNOSIS — Z23 Encounter for immunization: Secondary | ICD-10-CM | POA: Diagnosis not present

## 2019-12-16 DIAGNOSIS — Z1231 Encounter for screening mammogram for malignant neoplasm of breast: Secondary | ICD-10-CM | POA: Diagnosis not present

## 2020-05-18 DIAGNOSIS — H43813 Vitreous degeneration, bilateral: Secondary | ICD-10-CM | POA: Diagnosis not present

## 2020-05-18 DIAGNOSIS — H04123 Dry eye syndrome of bilateral lacrimal glands: Secondary | ICD-10-CM | POA: Diagnosis not present

## 2020-05-18 DIAGNOSIS — H524 Presbyopia: Secondary | ICD-10-CM | POA: Diagnosis not present

## 2020-05-18 DIAGNOSIS — Z961 Presence of intraocular lens: Secondary | ICD-10-CM | POA: Diagnosis not present

## 2020-05-26 DIAGNOSIS — Z23 Encounter for immunization: Secondary | ICD-10-CM | POA: Diagnosis not present

## 2020-06-06 DIAGNOSIS — E7800 Pure hypercholesterolemia, unspecified: Secondary | ICD-10-CM | POA: Diagnosis not present

## 2020-06-06 DIAGNOSIS — E559 Vitamin D deficiency, unspecified: Secondary | ICD-10-CM | POA: Diagnosis not present

## 2020-06-06 DIAGNOSIS — Z0000 Encounter for general adult medical examination without abnormal findings: Secondary | ICD-10-CM | POA: Diagnosis not present

## 2020-06-06 DIAGNOSIS — Z79899 Other long term (current) drug therapy: Secondary | ICD-10-CM | POA: Diagnosis not present

## 2020-06-06 DIAGNOSIS — Z1389 Encounter for screening for other disorder: Secondary | ICD-10-CM | POA: Diagnosis not present

## 2020-06-06 DIAGNOSIS — F410 Panic disorder [episodic paroxysmal anxiety] without agoraphobia: Secondary | ICD-10-CM | POA: Diagnosis not present

## 2020-06-06 DIAGNOSIS — M8580 Other specified disorders of bone density and structure, unspecified site: Secondary | ICD-10-CM | POA: Diagnosis not present

## 2020-06-23 DIAGNOSIS — L821 Other seborrheic keratosis: Secondary | ICD-10-CM | POA: Diagnosis not present

## 2020-06-23 DIAGNOSIS — L570 Actinic keratosis: Secondary | ICD-10-CM | POA: Diagnosis not present

## 2020-06-23 DIAGNOSIS — L814 Other melanin hyperpigmentation: Secondary | ICD-10-CM | POA: Diagnosis not present

## 2020-06-23 DIAGNOSIS — Z85828 Personal history of other malignant neoplasm of skin: Secondary | ICD-10-CM | POA: Diagnosis not present

## 2020-06-23 DIAGNOSIS — D225 Melanocytic nevi of trunk: Secondary | ICD-10-CM | POA: Diagnosis not present

## 2020-06-23 DIAGNOSIS — D1801 Hemangioma of skin and subcutaneous tissue: Secondary | ICD-10-CM | POA: Diagnosis not present

## 2020-12-27 ENCOUNTER — Other Ambulatory Visit: Payer: Self-pay

## 2020-12-27 ENCOUNTER — Ambulatory Visit: Payer: Medicare Other | Attending: Internal Medicine

## 2020-12-27 ENCOUNTER — Other Ambulatory Visit (HOSPITAL_BASED_OUTPATIENT_CLINIC_OR_DEPARTMENT_OTHER): Payer: Self-pay

## 2020-12-27 DIAGNOSIS — Z23 Encounter for immunization: Secondary | ICD-10-CM

## 2020-12-27 NOTE — Progress Notes (Signed)
   Covid-19 Vaccination Clinic  Name:  Nichole [name]    MRN: [id number] DOB: 1946-08-28  12/27/2020  [name] was observed post Covid-19 immunization for 15 minutes without incident. She was provided with Vaccine Information Sheet and instruction to access the V-Safe system.   [name] was instructed to call 911 with any severe reactions post vaccine: Marland Kitchen Difficulty breathing  . Swelling of face and throat  . A fast heartbeat  . A bad rash all over body  . Dizziness and weakness   Immunizations Administered    Name Date Dose VIS Date Route   Moderna Covid-19 Booster Vaccine 12/27/2020 11:28 AM 0.25 mL 07/05/2020 Intramuscular   Manufacturer: Moderna   Lot: [id number]   Lewis: [id number]

## 2020-12-29 ENCOUNTER — Other Ambulatory Visit (HOSPITAL_BASED_OUTPATIENT_CLINIC_OR_DEPARTMENT_OTHER): Payer: Self-pay

## 2020-12-29 MED ORDER — COVID-19 MRNA VACC (MODERNA) 100 MCG/0.5ML IM SUSP
INTRAMUSCULAR | 0 refills | Status: DC
  Filled 2020-12-29: qty 0.25, 1d supply, fill #0

## 2021-05-29 ENCOUNTER — Other Ambulatory Visit: Payer: Self-pay

## 2021-05-29 ENCOUNTER — Ambulatory Visit: Payer: Medicare Other | Admitting: Sports Medicine

## 2021-05-29 VITALS — Ht 65.0 in | Wt 148.0 lb

## 2021-05-29 DIAGNOSIS — M76899 Other specified enthesopathies of unspecified lower limb, excluding foot: Secondary | ICD-10-CM | POA: Diagnosis not present

## 2021-05-29 DIAGNOSIS — M7582 Other shoulder lesions, left shoulder: Secondary | ICD-10-CM

## 2021-05-29 NOTE — Patient Instructions (Signed)
Start Physical Therapy for you hamstrings and left shoulder. We will have you work with either Waunita Schooner or Lytle Michaels at [facility]. Follow up with me in 4 weeks.

## 2021-05-30 NOTE — Progress Notes (Signed)
   Subjective:    Patient ID: Nichole [name], female    DOB: Jul 25, 1946, 75 y.o.   MRN: [id number]  HPI chief complaint: Bilateral hamstring pain and left arm pain  Nichole [name] presents today with a couple of different complaints.  First complaint is pain that she localizes to the ischial tuberosity bilaterally.  She feels like she may have overdone it and yoga.  Her pain is most noticeable with bending forward and with sitting.  It is an achy discomfort that does not radiate.  It is starting to improve but has not resolved.  She denies any similar problems in the past.  Symptoms have been present for about 3 weeks. Second complaint is left arm pain this been present for about 1 week.  No trauma.  She describes an achy discomfort around the lateral shoulder as well as occasional sharp pain with certain motions.  Difficulty sleeping at night.  Difficulty with internal rotation.  She is not noticed any weakness.  Pain does not radiate down into the forearm or hand.  No associated numbness or tingling.  She has been taking over-the-counter Aleve for both her hip pain and her left arm pain.  Interim medical history reviewed Medications reviewed Allergies reviewed    Review of Systems As above    Objective:   Physical Exam  Developed, well nourished.  No acute distress  Left shoulder: Full range of motion with a positive painful arc.  No tenderness to palpation.  No atrophy.  Positive empty can, positive Hawkins.  Rotator cuff strength is 5/5 but does reproduce pain with resisted supraspinatus.  She is neurovascular intact distally.  Examination of both hips show smooth painless hip range of motion.  She is tender to palpation directly over the ischial tuberosities bilaterally.  No palpable defect.  Good strength.      Assessment & Plan:   Left shoulder pain secondary to rotator cuff impingement/tendinitis Posterior bilateral hip pain secondary to proximal hamstring tendinitis versus ischial  tuberosity bursitis  Since Nichole [name] has noticed good results with over-the-counter Aleve, she will stick with that for now.  I do think she would benefit from formal physical therapy and we have arranged for that to be done at the med center in [place].  She may wean to a home exercise program per the therapist discretion.  I did discuss the possibility of a short course of oral prednisone if symptoms do not improve.  Follow-up with me in 4 weeks for reevaluation or sooner if symptoms worsen.  This note was dictated using Dragon naturally speaking software and may contain errors in syntax, spelling, or content which have not been identified prior to signing this note.

## 2021-05-31 ENCOUNTER — Other Ambulatory Visit: Payer: Self-pay

## 2021-05-31 ENCOUNTER — Ambulatory Visit (HOSPITAL_BASED_OUTPATIENT_CLINIC_OR_DEPARTMENT_OTHER): Payer: Medicare Other | Attending: Sports Medicine | Admitting: Physical Therapy

## 2021-05-31 DIAGNOSIS — M6281 Muscle weakness (generalized): Secondary | ICD-10-CM | POA: Diagnosis present

## 2021-05-31 DIAGNOSIS — M25552 Pain in left hip: Secondary | ICD-10-CM | POA: Insufficient documentation

## 2021-05-31 DIAGNOSIS — M25512 Pain in left shoulder: Secondary | ICD-10-CM | POA: Insufficient documentation

## 2021-05-31 DIAGNOSIS — M25551 Pain in right hip: Secondary | ICD-10-CM | POA: Diagnosis not present

## 2021-05-31 DIAGNOSIS — M25511 Pain in right shoulder: Secondary | ICD-10-CM | POA: Diagnosis not present

## 2021-05-31 NOTE — Therapy (Signed)
OUTPATIENT PHYSICAL THERAPY LE EVALUATION   Patient Name: Nichole [name] MRN: [id number] DOB:1946/05/09, 75 y.o., female Today's Date: 06/01/2021  PCP: Lajean Manes, MD REFERRING PROVIDER: Thurman Coyer, DO   PT End of Session - 05/31/21 1636     Visit Number 1    Number of Visits 12    Date for PT Re-Evaluation 07/13/21    Authorization Type UHC Medicare    Progress Note Due on Visit --   06/28/2021   PT Start Time 1528    PT Stop Time 1613    PT Time Calculation (min) 45 min    Activity Tolerance Patient tolerated treatment well    Behavior During Therapy [facility] for tasks assessed/performed             History reviewed. No pertinent past medical history. History reviewed. No pertinent surgical history. There are no problems to display for this patient.   ONSET DATE: 05/09/2021  REFERRING DIAG: Bilat HS tendonitis  THERAPY DIAG:  Pain in Right Hip Pain in Left Hip Muscle Weakness  SUBJECTIVE:   SUBJECTIVE STATEMENT: Pt states she has pain at her "sits" bones.  This pain began 3 weeks ago with no specific MOI.  Pt c/o's of having pain on her ischial tuberosities primarily with sitting.  She has pain with sitting in car and in the chairs at [employer]  Pt does water aerobics 3x/wk and does not have pain in LE's.  She typically does yoga 6 days per week and had pain with certain yoga movements including Warrior 2 and 3.  She has been limiting certain yoga movements including forward bends and taking Aleve which has improved her pain.  Pt denies pain with walking.  Pt does occasionally have R piriformis pain with walking though feels better as she continues to walk.  Pt c/o's of generalized stiffness 1st thing in AM which improves with mobility.   Pt had no imaging.  MD note indicated proximal hamstring tendinitis versus ischial tuberosity bursitis.  Pt also reports having L shoulder pain which occurred more recently than her bilat hip pain.                                                                                                                                                                                                        PERTINENT HISTORY: Prior R hip/piriformis pain  PAIN:  Are you having pain? Yes VAS scale: 6-7/10 current, 8/10 worst, 2-3/10 best Pain location: bilat ischial tuberosities, posterior bilat hip  Pain orientation: Bilateral R > L PAIN TYPE: aching  Aggravating factors: sitting Relieving factors: walking, standing up, biofreeze  PRECAUTIONS: None  WEIGHT BEARING RESTRICTIONS No  FALLS: Has patient fallen in last 6 months? No  LIVING ENVIRONMENT: Lives with: Independent living at [employer] Lives in: House/apartment.  1 story home Stairs: None   PLOF: Independent and Pt was able to sit and perform her normal workout and recreational activities without posterior hip pain.   PATIENT GOALS reasonably sit without pain.  To learn to not overwork HS.  OBJECTIVE:   DIAGNOSTIC FINDINGS:  No imaging performed  PATIENT SURVEYS:  LEFS 54/80  COGNITION:  Overall cognitive status: Within functional limits for tasks assessed       LOWER EXTREMITY AROM/PROM:    A/PROM Right 06/01/2021 Left 06/01/2021  Hip flexion    Hip abduction [facility]  Hip adduction    Hip extension [facility]  Hip internal rotation    Hip external rotation 35 32  (Blank rows = not tested)  LOWER EXTREMITY MMT:  MMT Right 06/01/2021 Left 06/01/2021  Hip flexion 5/5 5/5  Hip abduction 5/5 5/5  Hip ER 4+/5 5/5  Hip extension 4-/5 4/5  Knee extension 5/5 5/5  Knee flexion 5/5 5/5  (Blank rows = not tested)  LE SPECIAL TESTS:  Ober's test negative bilat   PALPATION: TTP at Ischial tuberosities R > L and R piriformis/glute. none in bilat lateral hip  Gait: Pt ambulates with a normalized heel to toe gait without limping.    TODAY'S TREATMENT:  Pt education provided.  See below.   PATIENT EDUCATION: Education details: Educated pt  concerning POC and relevant anatomy including HS origin.  Showed pt attachment sites of HS and post hip visually thru media.  Instructed pt in what positions and exercises to avoid currently including end range range stretches,certain yoga movements, bending forward and any movement that causes pain or pulling in HS Person educated: Patient Education method: Explanation and Visually thru media Education comprehension: verbalized understanding   HOME EXERCISE PROGRAM: Will give next visit  ASSESSMENT:  CLINICAL IMPRESSION: Patient is a 76 y.o. female who was seen today for physical therapy evaluation and treatment for bilat HS tendonitis.  Pt has pain in bilat posterior hips at ischial tuberosities R > L and is tender to palpate bilat.  Her primary c/o is pain with sitting.  She has pain with sitting in car and in the chairs at [employer]  Pt remains active performing water aerobics 3x/wk and yoga.  She has limited certain yoga movements due to pain.  Pt reports she has been feeling better having improved pain since limiting certain yoga movements and taking Aleve.  Pt does occasionally have R piriformis pain with walking though feels better as she continues to walk.  Pt reports having L shoulder pain and has an order from MD for L RTC tendonitis.  Pt has muscle weakness in bilat hip extension and R hip ER.  Objective impairments include decreased activity tolerance, decreased strength, and pain. These impairments are limiting patient from driving and workout activities . Patient will benefit from skilled PT to address above impairments and improve overall function.  REHAB POTENTIAL: Good  CLINICAL DECISION MAKING: Stable/uncomplicated  EVALUATION COMPLEXITY: Low   GOALS:   SHORT TERM GOALS:  STG Name Target Date Goal status  1 Pt will be independent and compliant with HEP for improved pain, strength, and function. Baseline:  06/21/2021 INITIAL  2 Pt will report at least a 25% improvement  in pain with sitting activities.  Baseline:  06/21/2021 INITIAL  3 Pt's worst pain will be no > than 6/10 for improved pain overall. Baseline: 06/21/2021 INITIAL                       LONG TERM GOALS:   LTG Name Target Date Goal status  1 Pt will be able to drive without increased pain.  Baseline:   07/12/2021 INITIAL  2 Pt will be able to perform her normal seated activities without significant pain. Baseline: 07/12/2021 INITIAL  3 Pt will demo improved bilat hip extension strength and R hip ER strength to 5/5 MMT for improved tolerance with daily mobility and recreational/workout activities.  Baseline: 07/12/2021 INITIAL  4 Pt will be able to perform yoga without adverse effects Baseline: 07/12/2021 INITIAL                  PLAN: PT FREQUENCY: 1-2x/week  PT DURATION: other: 4-6 weeks  PLANNED INTERVENTIONS: Therapeutic exercises, Therapeutic activity, Neuro Muscular re-education, Patient/Family education, Joint mobilization, Aquatic Therapy, Electrical stimulation, Cryotherapy, Moist heat, Ultrasound, and Manual therapy  PLAN FOR NEXT SESSION: Establish HEP.  STM to hips and HS.  Will set up shoulder eval at a later date, but treat hips/HS next visit.   Selinda Michaels III PT, DPT 06/01/21 9:00 AM

## 2021-06-01 ENCOUNTER — Encounter (HOSPITAL_BASED_OUTPATIENT_CLINIC_OR_DEPARTMENT_OTHER): Payer: Self-pay | Admitting: Physical Therapy

## 2021-06-05 ENCOUNTER — Encounter (HOSPITAL_BASED_OUTPATIENT_CLINIC_OR_DEPARTMENT_OTHER): Payer: Self-pay | Admitting: Physical Therapy

## 2021-06-05 ENCOUNTER — Other Ambulatory Visit: Payer: Self-pay

## 2021-06-05 ENCOUNTER — Ambulatory Visit (HOSPITAL_BASED_OUTPATIENT_CLINIC_OR_DEPARTMENT_OTHER): Payer: Medicare Other | Admitting: Physical Therapy

## 2021-06-05 DIAGNOSIS — M25551 Pain in right hip: Secondary | ICD-10-CM

## 2021-06-05 DIAGNOSIS — M25552 Pain in left hip: Secondary | ICD-10-CM

## 2021-06-05 DIAGNOSIS — M6281 Muscle weakness (generalized): Secondary | ICD-10-CM

## 2021-06-05 NOTE — Therapy (Signed)
OUTPATIENT PHYSICAL THERAPY TREATMENT NOTE   Patient Name: Nichole [name] MRN: [id number] DOB:02-Jan-1946, 75 y.o., female Today's Date: 06/05/2021  PCP: Lajean Manes, MD REFERRING PROVIDER: Lajean Manes, MD   PT End of Session - 06/05/21 0857     Visit Number 2    Number of Visits 12    Date for PT Re-Evaluation 07/13/21    Authorization Type UHC Medicare    Progress Note Due on Visit --   06/28/2021   PT Start Time 0855    PT Stop Time 0928    PT Time Calculation (min) 33 min    Activity Tolerance Patient tolerated treatment well    Behavior During Therapy [facility] for tasks assessed/performed             History reviewed. No pertinent past medical history. History reviewed. No pertinent surgical history. There are no problems to display for this patient.   REFERRING DIAG: Bilat HS tendonitis  THERAPY DIAG:  Pain in Right Hip Pain in Left Hip Muscle Weakness  PERTINENT HISTORY: Prior R hip/piriformis pain  PRECAUTIONS: None  SUBJECTIVE:  She has pain with sitting in car and in the chairs at [employer]  Pt does water aerobics 3x/wk and does not have pain in LE's.  She has been limiting certain yoga movements including forward bends.  Pt states she takes Advil, not Aleve, which has improved her pain.  Pt states she could feel the strain in her proximal HS with lifting the laundry basket.  Pt was aware of that area with ascending stairs.  Pt reports reduced tenderness on her "sits" bones with sitting.  Pt states her shoulder has been a little worse lately.  Pt reports she applies pressure to bilat post hips when she is walking which improves her pain.    PAIN:  Are you having pain? Yes VAS scale: 5/10 Pain location: bilat proximal HS R > L Pain orientation: Bilateral  Aggravating factors: sitting Relieving factors: Advil, walking biofreeze    OBJECTIVE:   TODAY'S TREATMENT: TODAY'S TREATMENT:  Manual Therapy:   -Pt received STM to bilat prox to mid HS and  glute f/b rolling of R > L piriformis and glute and bilat HS To improve pain, soft tissue tightness, and mobility.  There Ex:   -Reviewed response to prior Rx, current function, and pain levels.  -Pt performed:   -bridge with eccentric lowering 2x10 reps   -S/L clams 2x10 reps bilat   -Assessed piriformis flexibility     PATIENT EDUCATION: Education details: Educated pt concerning POC and relevant anatomy including HS origin.  Instructed pt in what positions and exercises to avoid currently including end range range stretches,certain yoga movements, bending forward and any movement that causes pain or pulling in HS.  Pt received a HEP handout and was educated in correct form and appropriate frequency.  Instructed pt to stop the exercises if she has pain. Person educated: Patient Education method: Explanation, demonstration, verbal cues, and a handout. Education comprehension: verbalized understanding     HOME EXERCISE PROGRAM: Access Code: [email or web address] URL: [email or web address] Date: 06/05/2021 Prepared by: Ronny Flurry Exercises Supine Bridge - 1 x daily - 5-6 x weekly - 2 sets - 10 reps Clamshell - 1 x daily - 6-7 x weekly - 2 sets - 10 reps    ASSESSMENT:   CLINICAL IMPRESSION: Pt is improving in hip/HS pain and reports improved tenderness at proximal HS/origin.  Her primary c/o is pain with sitting.  Assessed piriformis flexibility > 90  deg and pt had good flexibility bilat.  Pt reports improved pain and mobility after soft tissue work.  Pt had no pain with exercises and PT gave pt a HEP handout.  She is limiting certain yoga movements due to pain.  Pt continues to have shoulder pain and has an order from MD for L RTC tendonitis.  Objective impairments include decreased activity tolerance, decreased strength, and pain.  Pt responded well to Rx stating she felt better after Rx having no increased pain.  Patient should benefit from skilled PT for improved pain, sx's, and  function.       GOALS:     SHORT TERM GOALS:   STG Name Target Date Goal status  1 Pt will be independent and compliant with HEP for improved pain, strength, and function. Baseline:  06/21/2021 INITIAL  2 Pt will report at least a 25% improvement in pain with sitting activities.  Baseline:  06/21/2021 INITIAL  3 Pt's worst pain will be no > than 6/10 for improved pain overall. Baseline: 06/21/2021 INITIAL                                        LONG TERM GOALS:    LTG Name Target Date Goal status  1 Pt will be able to drive without increased pain.  Baseline:   07/12/2021 INITIAL  2 Pt will be able to perform her normal seated activities without significant pain. Baseline: 07/12/2021 INITIAL  3 Pt will demo improved bilat hip extension strength and R hip ER strength to 5/5 MMT for improved tolerance with daily mobility and recreational/workout activities.  Baseline: 07/12/2021 INITIAL  4 Pt will be able to perform yoga without adverse effects Baseline: 07/12/2021 INITIAL                               PLAN:   PLANNED INTERVENTIONS: Therapeutic exercises, Therapeutic activity, Neuro Muscular re-education, Patient/Family education, Joint mobilization, Aquatic Therapy, Electrical stimulation, Cryotherapy, Moist heat, Ultrasound, and Manual therapy   PLAN FOR NEXT SESSION:   Cont with STM/manual therapy to hips and HS.   Review HEP and cont with there-ex.  Will set up shoulder eval at a later date.     HEP:  Access Code: [place] III PT, DPT 06/05/21 9:12 PM     [facility] [address] [place], [place], [address] Phone: [phone]   Fax:  [phone]  Patient name: Nichole [name] MRN: [id number] DOB: [birth year]

## 2021-06-11 ENCOUNTER — Other Ambulatory Visit: Payer: Self-pay

## 2021-06-11 ENCOUNTER — Ambulatory Visit (HOSPITAL_BASED_OUTPATIENT_CLINIC_OR_DEPARTMENT_OTHER): Payer: Medicare Other | Admitting: Physical Therapy

## 2021-06-11 ENCOUNTER — Encounter (HOSPITAL_BASED_OUTPATIENT_CLINIC_OR_DEPARTMENT_OTHER): Payer: Self-pay | Admitting: Physical Therapy

## 2021-06-11 DIAGNOSIS — M25551 Pain in right hip: Secondary | ICD-10-CM | POA: Diagnosis not present

## 2021-06-11 DIAGNOSIS — M25552 Pain in left hip: Secondary | ICD-10-CM

## 2021-06-11 DIAGNOSIS — M6281 Muscle weakness (generalized): Secondary | ICD-10-CM

## 2021-06-11 NOTE — Therapy (Signed)
OUTPATIENT PHYSICAL THERAPY TREATMENT NOTE   Patient Name: Nichole [name] MRN: [id number] DOB:Oct 23, 1945, 75 y.o., female Today's Date: 06/11/2021  PCP: Lajean Manes, MD REFERRING PROVIDER: Thurman Coyer, DO     History reviewed. No pertinent past medical history. History reviewed. No pertinent surgical history. There are no problems to display for this patient.   REFERRING DIAG: Bilat HS tendonitis  THERAPY DIAG:  Pain in Right Hip Pain in Left Hip Muscle Weakness  PERTINENT HISTORY: Prior R hip/piriformis pain  PRECAUTIONS: None  SUBJECTIVE:  Pt had no adverse effects after prior Rx.  Pt states she felt good for the following 3 days with her exercises.  Pt began having soreness on Saturday and had much difficulty with performing bridging.  Pt was able to perform S/L clamshells though began having soreness with bridging.  Pt states she had increased limp and felt better with walking when applying pressure to glute.  Yesterday she rested and is walking better currently.  Pt is continuing performing yoga and reports she didn't do any of the movements that pull on the HS.  She has pain with sitting in car and in the chairs at [employer]  Pt states she was miserable riding in the car to go to church yesterday.  Pt does water aerobics 3x/wk and does not have pain in LE's.  She has been limiting certain yoga movements including forward bends.  Pt can feel the strain in her proximal HS with bending.   Pt reports she applies pressure to bilat post hips when she is walking which improves her pain.    PAIN:  Are you having pain? Yes VAS scale: 0/10 pain with sitting, 5/10 pain with ambulation Pain location: bilat proximal HS R > L Pain orientation: Bilateral  Aggravating factors: sitting Relieving factors: Advil, walking biofreeze    OBJECTIVE:   TODAY'S TREATMENT: TODAY'S TREATMENT:  Manual Therapy:  (15 mins) -Pt received STM to bilat prox to mid HS and glute f/b rolling  of R > L piriformis and glute and bilat HS To improve pain, soft tissue tightness, and mobility.  There Ex: (27 mins)  -Reviewed response to prior Rx, current function, pain levels, and response to current Rx.  -Pt performed:   -Attempted bridge performing approx 3-4 reps though pt had significant soreness.   -S/L clams 2x10 reps bilat   -S/L hip abduction 2x10 bilat   -supine hip extension isometric with foot mildly elevated 10 x 5 seconds   -Updated and reviewed HEP.   Pt reports improved soreness after exercises.      PATIENT EDUCATION: Education details: Reviewed HEP and informed pt to stop doing supine bridging currently due to increased soreness.  Updated HEP with S/L hip abd and gave pt a handout.  Educated pt in correct form and appropriate frequency.  Instructed pt to stop the exercises if she has pain. Person educated: Patient Education method: Explanation, demonstration, verbal cues, and a handout. Education comprehension: verbalized understanding     HOME EXERCISE PROGRAM: Access Code: [email or web address] URL: [email or web address] Date: 06/05/2021 Prepared by: Ronny Flurry Exercises S/L hip abduction 2 sets of 10 reps, 5x/wk    ASSESSMENT:   CLINICAL IMPRESSION: Pt presents to Rx reporting increased soreness and increased limp after performing HEP consistently.  Pt feels the soreness when performing supine bridge and has difficulty with performing supine bridge.  Pt rested yesterday and is walking better today.  She is performing yoga and is limiting certain yoga movements.  Pt had no  c/o's with the exercises except supine bridging.  Reviewed and updated HEP and Pt demonstrates good understanding.  Objective impairments include decreased activity tolerance, decreased strength, and pain.  Pt reports improved soreness after performing ther ex.  She responded well to Rx stating she felt wonderful after Rx.  Patient should benefit from skilled PT for improved pain, sx's,  and function.         GOALS:     SHORT TERM GOALS:   STG Name Target Date Goal status  1 Pt will be independent and compliant with HEP for improved pain, strength, and function. Baseline:  06/21/2021 INITIAL  2 Pt will report at least a 25% improvement in pain with sitting activities.  Baseline:  06/21/2021 INITIAL  3 Pt's worst pain will be no > than 6/10 for improved pain overall. Baseline: 06/21/2021 INITIAL                                        LONG TERM GOALS:    LTG Name Target Date Goal status  1 Pt will be able to drive without increased pain.  Baseline:   07/12/2021 INITIAL  2 Pt will be able to perform her normal seated activities without significant pain. Baseline: 07/12/2021 INITIAL  3 Pt will demo improved bilat hip extension strength and R hip ER strength to 5/5 MMT for improved tolerance with daily mobility and recreational/workout activities.  Baseline: 07/12/2021 INITIAL  4 Pt will be able to perform yoga without adverse effects Baseline: 07/12/2021 INITIAL                               PLAN:   PLANNED INTERVENTIONS: Therapeutic exercises, Therapeutic activity, Neuro Muscular re-education, Patient/Family education, Joint mobilization, Aquatic Therapy, Electrical stimulation, Cryotherapy, Moist heat, Ultrasound, and Manual therapy   PLAN FOR NEXT SESSION:   Cont with STM/manual therapy to hips and HS.   Review HEP and cont with there-ex.  Assess response to new exercises.  Possible re-eval next Rx to evaluate shoulder.     HEP:  Access Code: Gardner III PT, DPT 06/11/21 1:25 PM      [facility] [address] [place], [place], [address] Phone: [phone]   Fax:  [phone]  Patient name: Nichole [name] MRN: [id number] DOB: Aug 22, 1946

## 2021-06-15 ENCOUNTER — Encounter (HOSPITAL_BASED_OUTPATIENT_CLINIC_OR_DEPARTMENT_OTHER): Payer: Self-pay | Admitting: Physical Therapy

## 2021-06-15 ENCOUNTER — Other Ambulatory Visit: Payer: Self-pay

## 2021-06-15 ENCOUNTER — Ambulatory Visit (HOSPITAL_BASED_OUTPATIENT_CLINIC_OR_DEPARTMENT_OTHER): Payer: Medicare Other | Admitting: Physical Therapy

## 2021-06-15 DIAGNOSIS — M25552 Pain in left hip: Secondary | ICD-10-CM

## 2021-06-15 DIAGNOSIS — M25551 Pain in right hip: Secondary | ICD-10-CM | POA: Diagnosis not present

## 2021-06-15 DIAGNOSIS — M25512 Pain in left shoulder: Secondary | ICD-10-CM

## 2021-06-15 DIAGNOSIS — M6281 Muscle weakness (generalized): Secondary | ICD-10-CM

## 2021-06-15 NOTE — Therapy (Signed)
OUTPATIENT PHYSICAL THERAPY SHOULDER EVALUATION AND LOWER EXTREMITY TREATMENT   Patient Name: Nichole [name] MRN: [id number] DOB:08/25/1946, 75 y.o., female Today's Date: 06/15/2021   PT End of Session - 06/15/21 1023     Visit Number 4    Number of Visits 12    Date for PT Re-Evaluation 07/13/21    Authorization Type [employer] Medicare    Progress Note Due on Visit --   07/13/2021   PT Start Time 0905    PT Stop Time 1015    PT Time Calculation (min) 70 min    Activity Tolerance Patient tolerated treatment well    Behavior During Therapy [facility] for tasks assessed/performed             History reviewed. No pertinent past medical history. History reviewed. No pertinent surgical history. There are no problems to display for this patient.   PCP: Lajean Manes, MD  REFERRING PROVIDER: Thurman Coyer, DO  REFERRING DIAG: Bilat HS tendonitis                                  L RTC tendonitis  THERAPY DIAG:  Pain in Right Hip Pain in Left shoulder Muscle Weakness Pain in Left Hip    ONSET DATE: 05/09/2021  SUBJECTIVE:                                                                                                                                                                                      SUBJECTIVE STATEMENT: Pt states her L shoulder pain began the same time as her hip. She denies any specific injury.  Pt c/o's of painful popping with mobility.  She states her popping is random.  If Pt moves very slow, she has less pain.  Pt has pain with quick movements.  She states she can have pain with minimal movement.  She is limited with reaching overhead and behind back. Has difficulty with donning/doffing bra.  Pt unable to lift anything heavy.  Pain with turning steering wheel.     Pt denies increased pain after prior Rx and actually felt better after Rx.  Pt states her hip/HS is doing better.  Pt tried bridging and had pain.  She has stopped performing bridging.  Sitting is  better.  Pt states her L HS is doing well though has pain in R.  Pt reports she applies pressure to bilat post hips when she is walking which improves her pain.    PERTINENT HISTORY: Prior R hip/piriformis pain.  Prior biceps tear on R and R shoulder surgery approx 20 years.  PAIN:  Are you having pain? (Shoulder)  Pt has pain though none sitting currently.  (HS) Yes Pain scale: (Shoulder) 9/10 worst pain, 0/10 best pain, No pain sitting at rest.  7/10 pain with performing seated ER/IR.  Glute/Proximal HS: 3/10 on R, 1.5/10 on L. Pain location:  Pain is worse in L biceps.  Post L shoulder pain Pain orientation: Left  PAIN TYPE: sharp Aggravating factors: movement Relieving factors: biofreeze, grabbing and holding L shoulder   WEIGHT BEARING RESTRICTIONS No    PLOF: Independent.  Pt was able to perform all of her normal reaching activities, lifting, and ADLs/IADLs without shoulder pain or difficulty.   PATIENT GOALS reduce popping, improved mobility without pain  OBJECTIVE:   DIAGNOSTIC FINDINGS:  No imaging was performed  PATIENT SURVEYS:  UEFI:  47/80   Pt is R hand dominant  UPPER EXTREMITY AROM/PROM:  A/PROM Right 06/15/2021 Left 06/15/2021  Shoulder flexion 166 163  Shoulder scaption 162 153  Shoulder abduction 132 138  Shoulder adduction    Shoulder extension    Shoulder internal rotation 67 67  Shoulder external rotation 75 50/57  Elbow flexion    Elbow extension    Wrist flexion    Wrist extension    Wrist ulnar deviation    Wrist radial deviation    Wrist pronation    Wrist supination    (Blank rows = not tested)  UPPER EXTREMITY MMT:  MMT Right 06/15/2021 Left 06/15/2021  Shoulder flexion 5/5 5/5  Shoulder scaption 5/5 5/5  Shoulder abduction 4/5 4-/5 with pain  Shoulder adduction    Shoulder ER 4+/5 4/5  Shoulder IR 4+/5 4/5  Lower trapezius    Elbow flexion    Elbow extension    Wrist flexion    Wrist extension    Wrist ulnar deviation     Wrist radial deviation    Wrist pronation    Wrist supination    Grip strength    (Blank rows = not tested)  SHOULDER SPECIAL TESTS:  Impingement tests: Neer's Impingement Test and Michel Bickers Impingement Test:  Negative on R and Positive on L.  Rotator cuff assessment: Empty Can Test:  negative bilat, Subscapularis lift off test:  negative bilat   Biceps assessment: Speed's test: positive  on R and negative on L  PALPATION:  Minimally tender in anterior shoulder and none otherwise in shoulder or biceps      TODAY'S TREATMENT:  Pt performed: -supine serratus punches 2x10 -supine shoulder ABC x 1 rep -prone extension x10 reps -prone row 2x10 -supine hip extension isometric with foot mildly elevated 10 x 5 seconds   PATIENT EDUCATION: Education details: Educated pt concerning objective findings and dx related to her shoulder.  Educated pt in [place].  Updated HEP and gave pt a HEP handout including shoulder exercises and supine hip extension isometric with foot mildly elevated.  Educated pt in correct form and appropriate frequency.  Instructed pt she should not have increased pain with HEP.   Person educated: Patient Education method: Explanation, Demonstration, Verbal cues, and Handouts Education comprehension: verbalized understanding and returned demonstration   HOME EXERCISE PROGRAM: Access Code: [email or web address] URL: [email or web address] Date: 06/15/2021 Prepared by: Ronny Flurry  Exercises Supine Shoulder Alphabet - 1 x daily - 7 x weekly - 1 sets - 1 reps Single Arm Serratus Punches - 1-2 x daily - 7 x weekly - 1-2 sets - 10 reps Prone Shoulder Row - 1 x daily - 6 x weekly - 2 sets -  10 reps Also gave pt a handout of supine hip extension isometric.  Once per day 1-2 sets of 10 reps with 5 second hold.   ASSESSMENT:  CLINICAL IMPRESSION: Patient is a 75 y.o. female who is already being treated for bilat HS tendinitis and was evaluated for L RTC  tendinitis today.  Pt states her L shoulder pain began bothering her around the same time as her hips.  Pt has increased L biceps and shoulder pain and popping with mobility of L shoulder.  Pt has pain and difficulty with reaching overhead and behind her back.  She has pain with turning steering wheel while driving.  Pt has reduced pain if she moves her arm slowly.  Pt had positive impingement testing and Speed's test.  Pt has muscle weakness in L shoulder.  She has good and symmetrical ROM except limitations and pain with ER.  Pt reports having recently increased pain in R hip though is feeling better now.  She was having pain with bridging and was instructed to stop that exercise.  Pt states her R hip/HS is feeling better today and she has improved pain with sitting.  Her primary c/o of R hip/HS pain is with sitting.  Her R hip is worse than L.  Objective impairments include decreased activity tolerance, decreased ROM, decreased strength, impaired UE functional use, and pain. These impairments are limiting patient from driving and workout activities, and dressing .  Patient should benefit from skilled PT to address impairments of both L shoulder and bilat hips/HS, address goals, and to restore PLOF.  REHAB POTENTIAL: Good  CLINICAL DECISION MAKING: Stable/uncomplicated  EVALUATION COMPLEXITY: Low   GOALS:   SHORT TERM GOALS:  STG Name Target Date Goal status  1 Pt will be independent and compliant with HEP for improved pain, strength, and function. Baseline:  06/21/2021 INITIAL  2 Pt will report at least a 25% improvement in pain with sitting activities. Baseline:  06/21/2021 INITIAL  3 Pt's worst pain will be no > than 6/10 for improved pain overall. Baseline: 06/21/2021 INITIAL  4 Pt will report at least a 25% improvement in pain and sx's with reaching and daily shoulder mobility Baseline: 06/29/2021 INITIAL  5 Pt will be able to turn steering wheel without significant shoulder  pain Baseline: 07/06/2021 INITIAL             LONG TERM GOALS:   LTG Name Target Date Goal status  1 Pt will be able to perform her normal seated activities without significant pain. Baseline: 07/13/2021 INITIAL  2 Pt will be able to perform her normal seated activities without significant pain. Baseline: 07/13/2021 INITIAL  3 Pt will demo improved bilat hip extension strength and R hip ER strength to 5/5 MMT for improved tolerance with daily mobility and recreational/workout activities.  Baseline: 07/13/2021 INITIAL  4 Pt will be able to perform yoga without adverse effects Baseline: 07/13/2021 INITIAL  5 Pt will demo improved L shoulder strength to 5/5 MMT except 4/5 in abd throughout for improved performance of daily activities, reaching, and lifting.  Baseline: 07/13/2021 INITIAL  6 Pt will report she is able to perform her normal overhead activities without significant pain or difficulty.   Baseline: 07/13/2021 INITIAL  7 Pt will be able to reach behind her back to don/doff bra without significant pain  Baseline: 07/13/2021 INITIAL   PLAN: PT FREQUENCY: 2x/week  PT DURATION: 4 weeks  PLANNED INTERVENTIONS:  Therapeutic exercises, Therapeutic activity, Neuro Muscular re-education, Patient/Family education,  Joint mobilization, Aquatic Therapy, Electrical stimulation, Cryotherapy, Moist heat, Ultrasound, and Manual therapy  PLAN FOR NEXT SESSION:  Cont with STM/manual therapy to hips and HS.   Review HEP and cont with there-ex.  Assess response to new exercises.  Cont with scapular stabilization for L shoulder.            Ronny Flurry 06/15/2021, 12:01 PM

## 2021-06-18 ENCOUNTER — Other Ambulatory Visit (HOSPITAL_BASED_OUTPATIENT_CLINIC_OR_DEPARTMENT_OTHER): Payer: Self-pay

## 2021-06-25 ENCOUNTER — Ambulatory Visit (HOSPITAL_BASED_OUTPATIENT_CLINIC_OR_DEPARTMENT_OTHER): Payer: Medicare Other | Attending: Sports Medicine | Admitting: Physical Therapy

## 2021-06-25 ENCOUNTER — Encounter (HOSPITAL_BASED_OUTPATIENT_CLINIC_OR_DEPARTMENT_OTHER): Payer: Self-pay | Admitting: Physical Therapy

## 2021-06-25 ENCOUNTER — Other Ambulatory Visit: Payer: Self-pay

## 2021-06-25 DIAGNOSIS — M25551 Pain in right hip: Secondary | ICD-10-CM | POA: Diagnosis not present

## 2021-06-25 DIAGNOSIS — M25512 Pain in left shoulder: Secondary | ICD-10-CM | POA: Diagnosis present

## 2021-06-25 DIAGNOSIS — M6281 Muscle weakness (generalized): Secondary | ICD-10-CM | POA: Diagnosis present

## 2021-06-25 DIAGNOSIS — M25552 Pain in left hip: Secondary | ICD-10-CM | POA: Insufficient documentation

## 2021-06-25 NOTE — Therapy (Signed)
OUTPATIENT PHYSICAL THERAPY SHOULDER EVALUATION AND LOWER EXTREMITY TREATMENT   Patient Name: Nichole [name] MRN: [id number] DOB:[birth year], 75 y.o., female Today's Date: 06/26/2021   PT End of Session - 06/25/21 0854     Visit Number 5    Number of Visits 12    Date for PT Re-Evaluation 07/13/21    Authorization Type [employer] Medicare    Progress Note Due on Visit --   07/13/2021   PT Start Time 0850    PT Stop Time 0934    PT Time Calculation (min) 44 min    Activity Tolerance Patient tolerated treatment well    Behavior During Therapy [facility] for tasks assessed/performed             History reviewed. No pertinent past medical history. History reviewed. No pertinent surgical history. There are no problems to display for this patient.   PCP: Lajean Manes, MD  REFERRING PROVIDER: Thurman Coyer, DO  REFERRING DIAG: Bilat HS tendonitis                                  L RTC tendonitis  THERAPY DIAG:  Pain in Right Hip Pain in Left shoulder Muscle Weakness Pain in Left Hip    ONSET DATE: 05/09/2021  SUBJECTIVE:                                                                                                                                                                                      SUBJECTIVE STATEMENT: -Pt  reports her shoulder popping is less.  She states her popping is random.  If Pt moves very slow, she has less pain.  Pt has pain with quick movements.  She states she can have pain with minimal movement.  She is limited with reaching overhead and behind back. Has difficulty with donning/doffing bra.  Pt unable to lift anything heavy.  Pain with turning steering wheel.    -Pt reports no adverse effects after prior Rx.  Pt states she went to [place] and did a lot of walking and also pulled the suitcase.  Pt had pain with sitting and had to get up multiple times due to pain.  Pt had pain sitting in the plane. Pt states she has regressed.  Pt states her HS pain  pain has spread out some in glutes and proximal thigh and is not just located at her ischial tuberosities.  Pt states her pain returned in her L HS and that was almost gone.  Pt had increased pain with performing stairs on vacation.  Pt states she typically feels  better with walking though not on vacation.  Pt reports she has has a knot in her L shoulder and feels better when she presses on it.    PERTINENT HISTORY: Prior R hip/piriformis pain.  Prior biceps tear on R and R shoulder surgery approx 20 years.   PAIN:  Are you having pain? (Shoulder)  Pt has pain though none sitting currently.  (HS) Yes Pain scale:  No pain sitting at rest. Shoulder pain comes and goes. 2/10 with ER/IR Glute/Proximal HS: 4/10 R > L. Aggravating factors: movement, sitting Relieving factors: biofreeze, grabbing and holding L shoulder   WEIGHT BEARING RESTRICTIONS No    PLOF: Independent.  Pt was able to perform all of her normal reaching activities, lifting, and ADLs/IADLs without shoulder pain or difficulty.   PATIENT GOALS reduce popping, improved mobility without pain  OBJECTIVE:    PALPATION:  Pt seems to have knot in lateral proximal UE which is worse with contraction.  Minimal tenderness at the knot.      TODAY'S TREATMENT:  Pt performed: -supine serratus punches 2x10 -supine shoulder ABC x 1 rep -prone row 2x10 -supine hip extension isometric with foot mildly elevated 10 x 5 seconds for 2 sets.  Pt performed incorrectly and was performing supine SLR.  -supine bridging 2x10 reps -pt received L shoulder ER PROM in supine per jt stiffness   -Manual Therapy: -rolling to bilat prox to mid HS and glute in prone to improve pain and soft tissue tightness and reduce myofascial adhesions and restrictions.   PATIENT EDUCATION: Education details: Pt required instruction for correct form with exercises including HEP.   Instructed pt to perform bridging at home. Person educated: Patient Education  method: Education, Media planner, verbal and tactile cues Education comprehension: verbalized understanding and returned demonstration   HOME EXERCISE PROGRAM: Access Code: [email or web address] URL: [email or web address] Date: 06/15/2021 Prepared by: Ronny Flurry  Exercises Supine Shoulder Alphabet - 1 x daily - 7 x weekly - 1 sets - 1 reps Single Arm Serratus Punches - 1-2 x daily - 7 x weekly - 1-2 sets - 10 reps Prone Shoulder Row - 1 x daily - 6 x weekly - 2 sets - 10 reps supine hip extension isometric.  Once per day 1-2 sets of 10 reps with 5 second hold. S/L clamshells  ASSESSMENT:  CLINICAL IMPRESSION: Pt presents to Rx reporting she has regressed.  She can feel the pain in her L hip more now which was not bothering her. Pt has been on vacation in [place].  She was compliant with HEP though was performing some exercises incorrectly.  Pt performed home exercises including supine hip extension isometric and prone row incorrectly.  She was performing a supine SLR instead of supine hip extension isometric.  Pt did a lot of walking and had pain with walking.  She typically has pain with sitting and continued to have pain with sitting including on the airplane.  Pt had pain at the beginning of bridges though felt better as she performed more. Pt able to complete bridging without increased pain.  Pt able to perform exercises with improved form with cuing and repetition.  She felt better during and after rolling of L HS and states she felt more on her L than R.  Pt responded well to Rx.  Patient should benefit from skilled PT to address impairments of both L shoulder and bilat hips/HS, address goals, and to restore PLOF  Objective impairments include:  decreased activity tolerance, decreased ROM, decreased  strength, impaired UE functional use, and pain.  These impairments are limiting patient from driving and workout activities, and dressing.     GOALS:   SHORT TERM GOALS:  STG Name  Target Date Goal status  1 Pt will be independent and compliant with HEP for improved pain, strength, and function. Baseline:  06/21/2021 INITIAL  2 Pt will report at least a 25% improvement in pain with sitting activities. Baseline:  06/21/2021 INITIAL  3 Pt's worst pain will be no > than 6/10 for improved pain overall. Baseline: 06/21/2021 INITIAL  4 Pt will report at least a 25% improvement in pain and sx's with reaching and daily shoulder mobility Baseline: 07/10/2021 INITIAL  5 Pt will be able to turn steering wheel without significant shoulder pain Baseline: 07/17/2021 INITIAL             LONG TERM GOALS:   LTG Name Target Date Goal status  1 Pt will be able to perform her normal seated activities without significant pain. Baseline: 07/13/2021 INITIAL  2 Pt will be able to perform her normal seated activities without significant pain. Baseline: 07/13/2021 INITIAL  3 Pt will demo improved bilat hip extension strength and R hip ER strength to 5/5 MMT for improved tolerance with daily mobility and recreational/workout activities.  Baseline: 07/13/2021 INITIAL  4 Pt will be able to perform yoga without adverse effects Baseline: 07/13/2021 INITIAL  5 Pt will demo improved L shoulder strength to 5/5 MMT except 4/5 in abd throughout for improved performance of daily activities, reaching, and lifting.  Baseline: 07/24/2021 INITIAL  6 Pt will report she is able to perform her normal overhead activities without significant pain or difficulty.   Baseline: 07/24/2021 INITIAL  7 Pt will be able to reach behind her back to don/doff bra without significant pain  Baseline: 07/24/2021 INITIAL   PLAN:  PLANNED INTERVENTIONS:  Therapeutic exercises, Therapeutic activity, Neuro Muscular re-education, Patient/Family education, Joint mobilization, Aquatic Therapy, Electrical stimulation, Cryotherapy, Moist heat, Ultrasound, and Manual therapy  PLAN FOR NEXT SESSION:  Cont with STM/manual therapy to hips  and HS.   Review HEP.  Cont with therapeutic exericse.  Cont with scapular stabilization for L shoulder.            Selinda Michaels III PT, DPT 06/26/21 12:17 PM

## 2021-06-26 ENCOUNTER — Ambulatory Visit
Admission: RE | Admit: 2021-06-26 | Discharge: 2021-06-26 | Disposition: A | Discharge status: Home / Self Care | Payer: Medicare Other | Source: Ambulatory Visit | Attending: Sports Medicine | Admitting: Sports Medicine

## 2021-06-26 ENCOUNTER — Ambulatory Visit: Payer: Medicare Other | Admitting: Sports Medicine

## 2021-06-26 ENCOUNTER — Other Ambulatory Visit (HOSPITAL_BASED_OUTPATIENT_CLINIC_OR_DEPARTMENT_OTHER): Payer: Self-pay

## 2021-06-26 VITALS — Ht 63.5 in | Wt 148.0 lb

## 2021-06-26 DIAGNOSIS — M76899 Other specified enthesopathies of unspecified lower limb, excluding foot: Secondary | ICD-10-CM | POA: Diagnosis not present

## 2021-06-26 DIAGNOSIS — M79602 Pain in left arm: Secondary | ICD-10-CM

## 2021-06-26 MED ORDER — PREDNISONE 10 MG PO TABS
ORAL_TABLET | ORAL | 0 refills | Status: DC
  Filled 2021-06-26: qty 21, 6d supply, fill #0

## 2021-06-27 NOTE — Progress Notes (Addendum)
   Subjective:    Patient ID: Nichole [name], female    DOB: 12/05/1945, 75 y.o.   MRN: [id number]  HPI  Nichole [name] presents today for follow-up on bilateral ischial tuberosity bursitis/proximal hamstring tendinitis as well as left rotator cuff tendinopathy.  In regards to her hip pain, she was making good progress in physical therapy until a recent trip to [place].  She feels like she has now regressed back to where she was prior to starting physical therapy.  She does still get good relief with over-the-counter anti-inflammatories.  She has also noticed a lump in the proximal humerus and is unsure whether or not that is related to her current shoulder pain.    Review of Systems As above    Objective:   Physical Exam  Well-developed, well-nourished.  No acute distress  Left shoulder: Full active and passive range of motion.  No tenderness to palpation.  There is a palpable area on the proximal humerus which very well may be normal anatomy but could represent a bone cyst.  It is not fluctuant.  It is not tender.  She has good strength with resisted supraspinatus but 4+/5 strength with resisted external rotation.  Good strength with resisted internal rotation.  Positive Hawkins, positive empty can.  Neurovascularly intact distally.      Assessment & Plan:   Bilateral ischial tuberosity bursitis/proximal hamstring tendinitis Left shoulder pain secondary to rotator cuff tendinopathy versus partial tear Questionable left proximal humerus bone cyst  I would like to try a 6-day Sterapred Dosepak for both her hips and her left shoulder pain.  She tells me that her hemoglobin A1c has been slightly elevated recently and she is scheduled to see her PCP later this afternoon.  I have asked her to discuss the use of oral steroids with her PCP and, if it is okay with him, then I will prescribe a 6-day Sterapred Dosepak.  If not, Nichole [name] will return to the office for a left shoulder subacromial cortisone  injection.  I want her to continue with physical therapy for both the shoulder and her hips and we will schedule a follow-up appointment in approximately another 4 weeks.  I we will also order a 2 view left humerus to further evaluate the palpable area on her proximal humerus which very well may simply represent normal anatomy.  This note was dictated using Dragon naturally speaking software and may contain errors in syntax, spelling, or content which have not been identified prior to signing this note.   Addendum: Dr. Felipa Eth is okay with Nichole [name] taking a 6-day Sterapred Dosepak.  We will call that in for her she will follow-up with me again in 4 weeks.  06/28/2021: X-ray reviewed.  It is unremarkable.  Patient will be notified via telephone of these results.

## 2021-06-28 ENCOUNTER — Encounter (HOSPITAL_BASED_OUTPATIENT_CLINIC_OR_DEPARTMENT_OTHER): Payer: Self-pay

## 2021-06-28 ENCOUNTER — Ambulatory Visit: Payer: Medicare Other

## 2021-06-28 ENCOUNTER — Encounter (HOSPITAL_BASED_OUTPATIENT_CLINIC_OR_DEPARTMENT_OTHER): Payer: Medicare Other | Admitting: Physical Therapy

## 2021-07-02 ENCOUNTER — Encounter (HOSPITAL_BASED_OUTPATIENT_CLINIC_OR_DEPARTMENT_OTHER): Payer: Self-pay | Admitting: Physical Therapy

## 2021-07-04 ENCOUNTER — Encounter (HOSPITAL_BASED_OUTPATIENT_CLINIC_OR_DEPARTMENT_OTHER): Payer: Self-pay | Admitting: Physical Therapy

## 2021-07-09 ENCOUNTER — Encounter (HOSPITAL_BASED_OUTPATIENT_CLINIC_OR_DEPARTMENT_OTHER): Payer: Self-pay | Admitting: Physical Therapy

## 2021-07-09 ENCOUNTER — Encounter (HOSPITAL_BASED_OUTPATIENT_CLINIC_OR_DEPARTMENT_OTHER): Payer: Medicare Other | Attending: Geriatric Medicine | Admitting: Physical Therapy

## 2021-07-09 ENCOUNTER — Other Ambulatory Visit: Payer: Self-pay

## 2021-07-09 DIAGNOSIS — M6281 Muscle weakness (generalized): Secondary | ICD-10-CM | POA: Insufficient documentation

## 2021-07-09 DIAGNOSIS — M25551 Pain in right hip: Secondary | ICD-10-CM | POA: Insufficient documentation

## 2021-07-09 DIAGNOSIS — M25552 Pain in left hip: Secondary | ICD-10-CM | POA: Insufficient documentation

## 2021-07-09 DIAGNOSIS — M25512 Pain in left shoulder: Secondary | ICD-10-CM | POA: Diagnosis present

## 2021-07-09 NOTE — Therapy (Signed)
OUTPATIENT PHYSICAL THERAPY SHOULDER EVALUATION AND LOWER EXTREMITY TREATMENT   Patient Name: Nichole [name] MRN: [id number] DOB:December 21, 1945, 75 y.o., female Today's Date: 07/09/2021   PT End of Session - 07/09/21 1601     Visit Number 6    Number of Visits 12    Date for PT Re-Evaluation 07/13/21    Authorization Type [employer] Medicare    Progress Note Due on Visit --   07/13/2021   PT Start Time 1530    PT Stop Time 1610    PT Time Calculation (min) 40 min    Activity Tolerance Patient tolerated treatment well    Behavior During Therapy [facility] for tasks assessed/performed              History reviewed. No pertinent past medical history. History reviewed. No pertinent surgical history. There are no problems to display for this patient.   PCP: Lajean Manes, MD  REFERRING PROVIDER: Thurman Coyer, DO  REFERRING DIAG: Bilat HS tendonitis                                  L RTC tendonitis  THERAPY DIAG:  Pain in Right Hip Pain in Left shoulder Muscle Weakness Pain in Left Hip    ONSET DATE: 05/09/2021  SUBJECTIVE:                                                                                                                                                                                      SUBJECTIVE STATEMENT: -Pt last saw PT on 06/25/21 and saw MD on 06/26/21.  She then tested positive for Covid on 06/27/21.  She isolated 10 days.  Pt was extremely fatigued and was in the bed for at least 5 days.  MD prescribed prednisone and pt took a 6 day dose.  Pt had an x-ray which was unremarkable.  Pt states her hips/glutes feel better but L shoulder is about the same.  Pt reports she hasn't done much at all lately due to having Covid.  Pt reports less clicking.  Pt reports walking has improved though she hasn't walked a lot.  Pt was able to perform her HEP while at home with Covid.  Pt hasn't been driving lately.  Pt also had a bone density test which showed a loss of calcium.   She states she now has ostepenia.  Pt returned to water aerobics class today and had no increased pain.    PERTINENT HISTORY: Prior R hip/piriformis pain.  Pt has been recently dx'd with osteopenia.  Prior biceps tear on R and R shoulder surgery  approx 20 years.    PAIN:  NRPS:  No pain in shoulder at rest and 1-2/10 pain with movement.  Pt denies any glute/proximal HS pain.  Aggravating factors: shoulder movement, sitting Relieving factors: grabbing and holding L shoulder, prednisone    OBJECTIVE:      TODAY'S TREATMENT:  Therapeutic Exercise: -Reviewed pt presentation, pain levels, and current function.  -Pt performed:  -supine serratus punches x10 with 1# and 2x10 with 2#  -supine shoulder ABC x 1 rep with 1#  -supine rhythmic stab's at 90 deg flexion 3x30 sec   -prone row 2x10  -prone extension to neutral 2x10 reps  -pt received L shoulder ER PROM in supine per jt stiffness   -Manual Therapy: -rolling to bilat prox to mid HS and glute in prone to improve pain and soft tissue tightness and reduce myofascial adhesions and restrictions.   PATIENT EDUCATION: Education details: Exercise form. Person educated: Patient Education method: Education, Media planner, verbal and tactile cues Education comprehension: verbalized understanding and returned demonstration. Required verbal and tactile cuing   HOME EXERCISE PROGRAM: Access Code: [email or web address] URL: [email or web address] Date: 06/15/2021 Prepared by: Ronny Flurry  Exercises Supine Shoulder Alphabet - 1 x daily - 7 x weekly - 1 sets - 1 reps Single Arm Serratus Punches - 1-2 x daily - 7 x weekly - 1-2 sets - 10 reps Prone Shoulder Row - 1 x daily - 6 x weekly - 2 sets - 10 reps supine hip extension isometric.  Once per day 1-2 sets of 10 reps with 5 second hold. S/L clamshells  ASSESSMENT:  CLINICAL IMPRESSION: Pt has been absent from PT due to having Covid.  Pt had a negative Covid test a couple of days  ago.  Pt has been inactive recently due to being sick from Covid and also has been on prednisone.  She states her hips/glutes are feeling much better though her shoulder is about the same.  Pt performed exercises well with cuing and instruction in correct form.  She was performing prone row incorrectly at home having shoulder more horizontally abducted.  She had pain with exercise though had reduced pain when performing correctly by her side.  Pt required verbal and tactile cuing for correct form and positioning with prone row.  Pt responded well to Rx having no c/o's after Rx.  Pt should benefit from cont skilled PT services to address goals and impairments and improve function.    Objective impairments include:  decreased activity tolerance, decreased ROM, decreased strength, impaired UE functional use, and pain.  These impairments are limiting patient from driving and workout activities, and dressing.     GOALS:   SHORT TERM GOALS:  STG Name Target Date Goal status  1 Pt will be independent and compliant with HEP for improved pain, strength, and function. Baseline:  06/21/2021 INITIAL  2 Pt will report at least a 25% improvement in pain with sitting activities. Baseline:  06/21/2021 INITIAL  3 Pt's worst pain will be no > than 6/10 for improved pain overall. Baseline: 06/21/2021 INITIAL  4 Pt will report at least a 25% improvement in pain and sx's with reaching and daily shoulder mobility Baseline: 07/23/2021 INITIAL  5 Pt will be able to turn steering wheel without significant shoulder pain Baseline: 07/30/2021 INITIAL             LONG TERM GOALS:   LTG Name Target Date Goal status  1 Pt will be able to perform her normal seated activities without significant  pain. Baseline: 07/13/2021 INITIAL  2 Pt will be able to perform her normal seated activities without significant pain. Baseline: 07/13/2021 INITIAL  3 Pt will demo improved bilat hip extension strength and R hip ER strength to  5/5 MMT for improved tolerance with daily mobility and recreational/workout activities.  Baseline: 07/13/2021 INITIAL  4 Pt will be able to perform yoga without adverse effects Baseline: 07/13/2021 INITIAL  5 Pt will demo improved L shoulder strength to 5/5 MMT except 4/5 in abd throughout for improved performance of daily activities, reaching, and lifting.  Baseline: 08/06/2021 INITIAL  6 Pt will report she is able to perform her normal overhead activities without significant pain or difficulty.   Baseline: 08/06/2021 INITIAL  7 Pt will be able to reach behind her back to don/doff bra without significant pain  Baseline: 08/06/2021 INITIAL   PLAN:  PLANNED INTERVENTIONS:  Therapeutic exercises, Therapeutic activity, Neuro Muscular re-education, Patient/Family education, Joint mobilization, Aquatic Therapy, Electrical stimulation, Cryotherapy, Moist heat, Ultrasound, and Manual therapy  PLAN FOR NEXT SESSION:  Cont with STM/manual therapy to hips and HS.   Review HEP.  Cont with therapeutic exericse.  Cont with scapular stabilization for L shoulder.            Selinda Michaels III PT, DPT 07/09/21 7:44 PM

## 2021-07-12 ENCOUNTER — Encounter (HOSPITAL_BASED_OUTPATIENT_CLINIC_OR_DEPARTMENT_OTHER): Payer: Self-pay | Admitting: Physical Therapy

## 2021-07-12 ENCOUNTER — Other Ambulatory Visit: Payer: Self-pay

## 2021-07-12 ENCOUNTER — Ambulatory Visit (HOSPITAL_BASED_OUTPATIENT_CLINIC_OR_DEPARTMENT_OTHER): Payer: Medicare Other | Admitting: Physical Therapy

## 2021-07-12 DIAGNOSIS — M25551 Pain in right hip: Secondary | ICD-10-CM

## 2021-07-12 DIAGNOSIS — M25512 Pain in left shoulder: Secondary | ICD-10-CM

## 2021-07-12 DIAGNOSIS — M6281 Muscle weakness (generalized): Secondary | ICD-10-CM

## 2021-07-12 DIAGNOSIS — M25552 Pain in left hip: Secondary | ICD-10-CM

## 2021-07-12 NOTE — Therapy (Signed)
OUTPATIENT PHYSICAL THERAPY SHOULDER EVALUATION AND LOWER EXTREMITY TREATMENT   Patient Name: Nichole [name] MRN: [id number] DOB:1946/02/28, 75 y.o., female Today's Date: 07/12/2021   PT End of Session - 07/12/21 1532     Visit Number 7    Number of Visits 12    Date for PT Re-Evaluation 07/13/21    Authorization Type [employer] Medicare    PT Start Time 1522    PT Stop Time 1603    PT Time Calculation (min) 41 min    Activity Tolerance Patient tolerated treatment well    Behavior During Therapy [facility] for tasks assessed/performed               History reviewed. No pertinent past medical history. History reviewed. No pertinent surgical history. There are no problems to display for this patient.   PCP: Lajean Manes, MD  REFERRING PROVIDER: Thurman Coyer, DO  REFERRING DIAG: Bilat HS tendonitis                                  L RTC tendonitis  THERAPY DIAG:  Pain in Right Hip Pain in Left shoulder Muscle Weakness Pain in Left Hip    ONSET DATE: 05/09/2021  SUBJECTIVE:                                                                                                                                                                                      SUBJECTIVE STATEMENT: -Pt last saw PT on 06/25/21 and saw MD on 06/26/21.  She then tested positive for Covid on 06/27/21.  She isolated 10 days.  Pt was extremely fatigued and was in the bed for at least 5 days.  MD prescribed prednisone and pt took a 6 day dose.  Pt had an x-ray which was unremarkable.  Pt states her hips/glutes feel better but L shoulder is about the same.  Pt reports she hasn't done much at all lately due to having Covid.  Pt reports less clicking.  Pt reports walking has improved though she hasn't walked a lot.  Pt was able to perform her HEP while at home with Covid.  .  Pt returned to water aerobics class and did ok.  She did limit certain exercises in class.   -Pt states she felt pretty good after prior  Rx.  Pt reports she cleaned out a storage room and had soreness in her L UE though not really painful.  Pt was able to use the blower and broom.  Pt states her shoulder did fairly well with cleaning out storage room.  Pt had  to sit for 2 hours for a meeting and could feel it in her bilat glutes/bilat prox HS.  She didn't have pain though could feel it.  Worst pain in shoulder is with driving.  Pt has pain and difficulty with reaching for seat belt.   PERTINENT HISTORY: OsteopeniaPrior R hip/piriformis pain.  Pt has been recently dx'd with osteopenia.  Prior biceps tear on R and R shoulder surgery approx 20 years.    PAIN:  NRPS:  1-2/10 pain in L shoulder but primarily soreness.  Pt denies any glute/proximal HS pain.  Aggravating factors: shoulder movement, sitting, driving Relieving factors: grabbing and holding L shoulder, prednisone    OBJECTIVE:      TODAY'S TREATMENT:  Therapeutic Exercise: -Reviewed pt presentation, response to prior Rx, pain levels, and current function.  -Pt performed:  -supine serratus punches 2# and 2x10   -supine shoulder ABC x 1 rep with 2#  -supine rhythmic stab's at 90 deg flexion 3x30 sec   -prone row 2x10  -prone extension to neutral 2x10 reps  -pt received L shoulder flexion, abd, ER, and IR PROM in supine per jt stiffness   -Manual Therapy: -rolling to bilat prox to mid HS and glute in prone to improve pain and soft tissue tightness and reduce myofascial adhesions and restrictions.   PATIENT EDUCATION: Education details: Exercise form. Person educated: Patient Education method: Education, Media planner, verbal and tactile cues Education comprehension: verbalized understanding and returned demonstration. Required verbal and tactile cuing   HOME EXERCISE PROGRAM: Access Code: [email or web address] URL: [email or web address] Date: 06/15/2021 Prepared by: Ronny Flurry  Exercises Supine Shoulder Alphabet - 1 x daily - 7 x weekly - 1 sets -  1 reps Single Arm Serratus Punches - 1-2 x daily - 7 x weekly - 1-2 sets - 10 reps Prone Shoulder Row - 1 x daily - 6 x weekly - 2 sets - 10 reps supine hip extension isometric.  Once per day 1-2 sets of 10 reps with 5 second hold. S/L clamshells  ASSESSMENT:  CLINICAL IMPRESSION: Pt has been absent from PT due to having Covid.  Pt has been inactive recently due to being sick from Covid and also has been on prednisone.  Pt has improved sx's in hips/glutes though continues to have pain and limitations with L shoulder.  She reports less clicking in shoulder and was able to clean out her storage room without adverse effects.  She still feels it in her R > L glute with sitting though overall is better.  Pt requires multi-modal cuing and instruction for correct form.  Pt responded well to Rx having no c/o's after Rx.  She reports she has no pain or discomfort in hips after rolling.  Pt should benefit from cont skilled PT services to address goals and impairments and improve function.    Objective impairments include:  decreased activity tolerance, decreased ROM, decreased strength, impaired UE functional use, and pain.  These impairments are limiting patient from driving and workout activities, and dressing.     GOALS:   SHORT TERM GOALS:  STG Name Target Date Goal status  1 Pt will be independent and compliant with HEP for improved pain, strength, and function. Baseline:  06/21/2021 INITIAL  2 Pt will report at least a 25% improvement in pain with sitting activities. Baseline:  06/21/2021 INITIAL  3 Pt's worst pain will be no > than 6/10 for improved pain overall. Baseline: 06/21/2021 INITIAL  4 Pt will report at least a 25% improvement in  pain and sx's with reaching and daily shoulder mobility Baseline: 07/26/2021 INITIAL  5 Pt will be able to turn steering wheel without significant shoulder pain Baseline: 08/02/2021 INITIAL             LONG TERM GOALS:   LTG Name Target Date Goal status   1 Pt will be able to perform her normal seated activities without significant pain. Baseline: 07/13/2021 INITIAL  2 Pt will be able to perform her normal seated activities without significant pain. Baseline: 07/13/2021 INITIAL  3 Pt will demo improved bilat hip extension strength and R hip ER strength to 5/5 MMT for improved tolerance with daily mobility and recreational/workout activities.  Baseline: 07/13/2021 INITIAL  4 Pt will be able to perform yoga without adverse effects Baseline: 07/13/2021 INITIAL  5 Pt will demo improved L shoulder strength to 5/5 MMT except 4/5 in abd throughout for improved performance of daily activities, reaching, and lifting.  Baseline: 08/09/2021 INITIAL  6 Pt will report she is able to perform her normal overhead activities without significant pain or difficulty.   Baseline: 08/09/2021 INITIAL  7 Pt will be able to reach behind her back to don/doff bra without significant pain  Baseline: 08/09/2021 INITIAL   PLAN:  PLANNED INTERVENTIONS:  Therapeutic exercises, Therapeutic activity, Neuro Muscular re-education, Patient/Family education, Joint mobilization, Aquatic Therapy, Electrical stimulation, Cryotherapy, Moist heat, Ultrasound, and Manual therapy  PLAN FOR NEXT SESSION:  PN next visit.  Cont with STM/manual therapy to hips and HS.   Review HEP.  Cont with therapeutic exericse.  Cont with scapular stabilization for L shoulder.            Selinda Michaels III PT, DPT 07/12/21 11:47 PM

## 2021-07-17 ENCOUNTER — Other Ambulatory Visit (HOSPITAL_BASED_OUTPATIENT_CLINIC_OR_DEPARTMENT_OTHER): Payer: Self-pay

## 2021-07-17 MED ORDER — MUPIROCIN 2 % EX OINT
TOPICAL_OINTMENT | CUTANEOUS | 0 refills | Status: DC
  Filled 2021-07-17: qty 22, 7d supply, fill #0

## 2021-07-18 ENCOUNTER — Encounter (HOSPITAL_BASED_OUTPATIENT_CLINIC_OR_DEPARTMENT_OTHER): Payer: Self-pay | Admitting: Physical Therapy

## 2021-07-18 ENCOUNTER — Other Ambulatory Visit: Payer: Self-pay

## 2021-07-18 ENCOUNTER — Ambulatory Visit (HOSPITAL_BASED_OUTPATIENT_CLINIC_OR_DEPARTMENT_OTHER): Payer: Medicare Other | Attending: Sports Medicine | Admitting: Physical Therapy

## 2021-07-18 DIAGNOSIS — M25551 Pain in right hip: Secondary | ICD-10-CM | POA: Diagnosis not present

## 2021-07-18 DIAGNOSIS — M6281 Muscle weakness (generalized): Secondary | ICD-10-CM | POA: Diagnosis present

## 2021-07-18 DIAGNOSIS — M25512 Pain in left shoulder: Secondary | ICD-10-CM | POA: Diagnosis present

## 2021-07-18 DIAGNOSIS — M25552 Pain in left hip: Secondary | ICD-10-CM | POA: Diagnosis present

## 2021-07-18 NOTE — Therapy (Signed)
OUTPATIENT PHYSICAL THERAPY TREATMENT/PROGRESS NOTE  Progress Note Reporting Period 06/15/2021 to 07/18/2021  See note below for Objective Data and Assessment of Progress/Goals.      Patient Name: Nichole [name] MRN: [id number] DOB:03/12/1946, 75 y.o., female Today's Date: 07/18/2021     History reviewed. No pertinent past medical history. History reviewed. No pertinent surgical history. There are no problems to display for this patient.   PCP: Lajean Manes, MD  REFERRING PROVIDER: Thurman Coyer, DO  REFERRING DIAG: Bilat HS tendonitis                                  L RTC tendonitis  THERAPY DIAG:  Pain in Right Hip Pain in Left shoulder Muscle Weakness Pain in Left Hip    ONSET DATE: 05/09/2021  SUBJECTIVE:                                                                                                                                                                                      SUBJECTIVE STATEMENT: Pt states her leg/glute and shoulder pain are improving.  Pt states her shoulder is better everywhere except her biceps.  She continues to have pain in L biceps.  Pt had a melanoma spot on her R shoulder removed yesterday and states she was tense.  Pt woke up this AM very stiff though feels better now after walking.  Pt reports 2/10 shoulder pain with driving, and also has glute/HS pain with driving/sitting in car.  Pt has HS/glute pain with bending.  Pt reports improved tenderness at ischial tuberosities and improved pain with sitting overall.   Pt reports improved mobility in L shoulder.  Pt has improved with reaching overhead though has difficulty reaching behind back.  Has difficulty with donning/doffing bra.  If Pt moves very slow, she has less pain.  Pt has shoulder pain with quick movements.  Pt still has popping though popping has improved.  Pt reports 40% improvement in pain with sitting activities and in pain and sx's in L shoulder with daily  mobility  PERTINENT HISTORY: Prior R hip/piriformis pain.  Prior biceps tear on R and R shoulder surgery approx 20 years.   PAIN:  Are you having pain? (Shoulder)  Pt has pain though none sitting currently.  (HS) Yes Pain scale: (Shoulder) 4-5/10 worst pain, 0/10 best pain, No pain sitting at rest.  1-2/10 pain with performing seated ER/IR.  Glute/Proximal HS: 2/10 bilat with sitting, 0/10 pain with walking. 0/10 best, 4-5/10 worst Pain location:  L biceps and post L shoulder pain.   Pain orientation: Left  PAIN TYPE: sharp Aggravating factors: movement, bending Relieving factors: biofreeze, grabbing and holding L shoulder   WEIGHT BEARING RESTRICTIONS No    PLOF: Independent.  Pt was able to perform all of her normal reaching activities, lifting, and ADLs/IADLs without shoulder pain or difficulty.   PATIENT GOALS reduce popping, improved mobility without pain  OBJECTIVE:   PATIENT SURVEYS:  UEFI:  71/80 LEFS:  66/80   Pt is R hand dominant  UPPER EXTREMITY AROM/PROM:  A/PROM Right 07/18/2021 Left 07/18/2021  Shoulder flexion 166 164  Shoulder scaption 162 154  Shoulder abduction 132 135  Shoulder adduction    Shoulder extension    Shoulder internal rotation 67 74  Shoulder external rotation 75 53/67  Elbow flexion    Elbow extension    Wrist flexion    Wrist extension    Wrist ulnar deviation    Wrist radial deviation    Wrist pronation    Wrist supination    (Blank rows = not tested)  UPPER EXTREMITY MMT:  MMT Right 07/18/2021 Left 07/18/2021  Shoulder flexion  5/5  Shoulder scaption  5/5  Shoulder abduction  4+/5 with pain  Shoulder adduction    Shoulder ER  4/5  Shoulder IR  4+/5  Lower trapezius    Elbow flexion    Elbow extension    Wrist flexion    Wrist extension    Wrist ulnar deviation    Wrist radial deviation    Wrist pronation    Hip ER 5/5 5/5  Hip Extension 5/5 4+/5  (Blank rows = not tested)  SHOULDER SPECIAL  TESTS:  Impingement tests: Neer's Impingement Test:  Positive on L.  Michel Bickers Impingement Test: negative on L   Biceps assessment: Speed's test: positive  on L  PALPATION:  No tenderness in palpation L shoulder.  TTP at bilat glute/piriformis and ischial tuberosities  Gait: Pt ambulates with a normalized heel to toe gait without limping.     Therapeutic Exercise:  -Reviewed current function, pain levels, and reported functional progress/deficits -Assessed ROM, special testing, and shoulder ROM -Pt completed UEFI and LEFS -See below for pt education   PATIENT EDUCATION: Education details: Educated pt concerning objective findings and dx related to her shoulder.  Educated pt in [place].  Instructed pt to cont with HEP.      Person educated: Patient Education method: Explanation, Demonstration Education comprehension: verbalized understanding and returned demonstration   HOME EXERCISE PROGRAM: Access Code: [email or web address] URL: [email or web address]     ASSESSMENT:  CLINICAL IMPRESSION: Pt has had limited PT visits due to being out of town and also having Covid.  Pt has significantly improved pain in both areas as evidenced by subjective reports.  She continues to have pain though is improving.  She has reduced pain with L shoulder mobility.  Pt cont to have HS/glute pain with sitting activities though has improved.  Pt demonstrates improved L shoulder abd strength, bilat hip extension strength, and R hip ER strength.  Pt had no improvement in shoulder ER strength and minimal improvement in IR strength. She also demonstrated improved ER and IR ROM.  Pt demonstrated clinically significant improvement in self perceived disability with UEFI improving from 47/80 initially to 71/80 currently.  Pt has met STG's #1-3 and is progressing toward LTG's.  Patient should benefit from continued skilled PT to address impairments of both L shoulder and bilat hips/HS, address goals, and to  restore PLOF.   Objective impairments include decreased activity tolerance, decreased ROM, decreased  strength, impaired UE functional use, and pain. These impairments are limiting patient from driving and workout activities, and dressing .     REHAB POTENTIAL: Good  CLINICAL DECISION MAKING: Stable/uncomplicated  EVALUATION COMPLEXITY: Low   GOALS:   SHORT TERM GOALS:  STG Name Target Date Goal status  1 Pt will be independent and compliant with HEP for improved pain, strength, and function. Baseline:  06/21/2021 GOAL MET  2 Pt will report at least a 25% improvement in pain with sitting activities. Baseline:  06/21/2021 GOAL MET  3 Pt's worst pain will be no > than 6/10 for improved pain overall. Baseline: 06/21/2021 GOAL MET  4 Pt will report at least a 25% improvement in pain and sx's with reaching and daily shoulder mobility Baseline: 08/01/2021 GOAL MET  5 Pt will be able to turn steering wheel without significant shoulder pain Baseline: 08/08/2021 INITIAL             LONG TERM GOALS:   LTG Name Target Date Goal status  1 Pt will be able to perform her normal seated activities without significant pain. Baseline: 07/13/2021 ONGOING       2 Pt will demo improved bilat hip extension strength and R hip ER strength to 5/5 MMT for improved tolerance with daily mobility and recreational/workout activities.  Baseline: 07/13/2021 PARTIALLY MET  3 Pt will be able to perform yoga without adverse effects Baseline: 07/13/2021 INITIAL  4 Pt will demo improved L shoulder strength to 5/5 MMT except 4/5 in abd throughout for improved performance of daily activities, reaching, and lifting.  Baseline: 08/15/2021 ONGOING  5 Pt will report she is able to perform her normal overhead activities without significant pain or difficulty.   Baseline: 08/15/2021 PARTIALLY MET  6 Pt will be able to reach behind her back to don/doff bra without significant pain  Baseline: 08/15/2021 INITIAL    PLAN: PT FREQUENCY: 2x/week  PT DURATION: 4 weeks  PLANNED INTERVENTIONS:  Therapeutic exercises, Therapeutic activity, Neuro Muscular re-education, Patient/Family education, Joint mobilization, Aquatic Therapy, Electrical stimulation, Cryotherapy, Moist heat, Ultrasound, and Manual therapy  PLAN FOR NEXT SESSION:  Cont with STM/manual therapy to hips and HS.   Cont with hip strengthening and scapular stabilization for L shoulder.  Cont with shoulder PROM.      Selinda Michaels III PT, DPT 07/19/21 7:42 PM

## 2021-07-20 ENCOUNTER — Other Ambulatory Visit (HOSPITAL_BASED_OUTPATIENT_CLINIC_OR_DEPARTMENT_OTHER): Payer: Self-pay

## 2021-07-20 MED ORDER — ALENDRONATE SODIUM 70 MG PO TABS
ORAL_TABLET | ORAL | 11 refills | Status: DC
  Filled 2021-07-20: qty 4, 30d supply, fill #0
  Filled 2021-08-13: qty 12, 90d supply, fill #1
  Filled 2021-11-05: qty 12, 90d supply, fill #2
  Filled 2022-01-31: qty 12, 90d supply, fill #3
  Filled 2022-04-29: qty 12, 90d supply, fill #4
  Filled 2022-04-29: qty 8, 56d supply, fill #4

## 2021-07-23 ENCOUNTER — Encounter (HOSPITAL_BASED_OUTPATIENT_CLINIC_OR_DEPARTMENT_OTHER): Payer: Self-pay | Admitting: Physical Therapy

## 2021-07-23 ENCOUNTER — Other Ambulatory Visit (HOSPITAL_BASED_OUTPATIENT_CLINIC_OR_DEPARTMENT_OTHER): Payer: Self-pay

## 2021-07-23 ENCOUNTER — Ambulatory Visit: Payer: Medicare Other | Attending: Internal Medicine

## 2021-07-23 ENCOUNTER — Ambulatory Visit (HOSPITAL_BASED_OUTPATIENT_CLINIC_OR_DEPARTMENT_OTHER): Payer: Medicare Other | Admitting: Physical Therapy

## 2021-07-23 ENCOUNTER — Other Ambulatory Visit: Payer: Self-pay

## 2021-07-23 DIAGNOSIS — M25552 Pain in left hip: Secondary | ICD-10-CM

## 2021-07-23 DIAGNOSIS — M6281 Muscle weakness (generalized): Secondary | ICD-10-CM

## 2021-07-23 DIAGNOSIS — M25551 Pain in right hip: Secondary | ICD-10-CM | POA: Diagnosis not present

## 2021-07-23 DIAGNOSIS — Z23 Encounter for immunization: Secondary | ICD-10-CM

## 2021-07-23 DIAGNOSIS — M25512 Pain in left shoulder: Secondary | ICD-10-CM

## 2021-07-23 MED ORDER — MODERNA COVID-19 BIVAL BOOSTER 50 MCG/0.5ML IM SUSP
INTRAMUSCULAR | 0 refills | Status: DC
  Filled 2021-07-23: qty 0.5, 1d supply, fill #0

## 2021-07-23 NOTE — Progress Notes (Signed)
   Covid-19 Vaccination Clinic  Name:  Nichole [name]    MRN: [id number] DOB: 30-Aug-1946  07/23/2021  [name] was observed post Covid-19 immunization for 15 minutes without incident. She was provided with Vaccine Information Sheet and instruction to access the V-Safe system.   [name] was instructed to call 911 with any severe reactions post vaccine: Difficulty breathing  Swelling of face and throat  A fast heartbeat  A bad rash all over body  Dizziness and weakness   Immunizations Administered     Name Date Dose VIS Date Route   Moderna Covid-19 vaccine Bivalent Booster 07/23/2021  2:58 PM 0.5 mL 04/28/2021 Intramuscular   Manufacturer: Moderna   Lot: [id number]   [place]: 23702-301-72

## 2021-07-23 NOTE — Therapy (Signed)
OUTPATIENT PHYSICAL THERAPY TREATMENT      Patient Name: Nichole [name] MRN: [id number] DOB:September 14, 1946, 75 y.o., female Today's Date: 07/23/2021   PT End of Session - 07/23/21 1448     Visit Number 9    Number of Visits 16    Date for PT Re-Evaluation 08/15/21    Authorization Type [employer] Medicare    PT Start Time 1356    PT Stop Time 1440    PT Time Calculation (min) 44 min    Activity Tolerance Patient tolerated treatment well    Behavior During Therapy [facility] for tasks assessed/performed              History reviewed. No pertinent past medical history. History reviewed. No pertinent surgical history. There are no problems to display for this patient.   PCP: Lajean Manes, MD  REFERRING PROVIDER: Thurman Coyer, DO  REFERRING DIAG: Bilat HS tendonitis                                  L RTC tendonitis  THERAPY DIAG:  Pain in Right Hip Pain in Left shoulder Muscle Weakness Pain in Left Hip    ONSET DATE: 05/09/2021  SUBJECTIVE:                                                                                                                                                                                      SUBJECTIVE STATEMENT: -Pt states her leg/glute and shoulder pain are improving.  She continues to have pain in L biceps.  Pt had a melanoma spot on her R shoulder removed last week and has stitches.  Pt reports 2/10 shoulder pain with driving, and also has glute/HS pain with driving/sitting in car.  Pt has HS/glute pain with bending.  Pt reports improved mobility in L shoulder.  Pt has improved with reaching overhead though has difficulty reaching behind back.  Has difficulty with donning/doffing bra though has improved with reaching.  If Pt moves very slow with L UE, she has less pain.  Pt has shoulder pain with quick movements.  Pain with turning steering wheel.  Pt still has popping though popping has improved.  -Pt reports 40% improvement in pain with sitting  activities and in pain and sx's in L shoulder with daily mobility -Pt states she continues to have pain in bilat glutes with sitting.  She states it's more sore than pain, but sitting is still an issue.  She had increased pain/soreness riding in car to [employer] and also sitting in church.  Pt found a different pillow to sit  on in car which may help.  Pt denies any adverse effects after prior Rx.  Pt reports compliance with HEP.  Her L LE has been bothering her more with R LE.   PERTINENT HISTORY: Prior R hip/piriformis pain.  Osteopenia, Prior biceps tear on R and R shoulder surgery approx 20 years.   PAIN:  Pt has no shoulder pain at rest and no glute/HS pain with standing.  2/10 pain in glute/HS pain with sitting. Pain scale: (Shoulder) 4-5/10 worst pain, 0/10 best pain.  Glute/Proximal HS: 2/10 bilat with sitting, 0/10 pain with walking. 0/10 best, 4-5/10 worst Pain location:  L biceps and post L shoulder pain.  Bilat glute/prox HS     PLOF: Independent.  Pt was able to perform all of her normal reaching activities, lifting, and ADLs/IADLs without shoulder pain or difficulty.   PATIENT GOALS reduce popping, improved mobility without pain.  To be able turn the steering wheel.  Reaching for her bra  OBJECTIVE:   Pt is R hand dominant    Therapeutic Exercise:  -Reviewed current function, functional improvement/deficits, pain levels, HEP compliance, and response to prior Rx. -Reviewed HEP. -Pt performed:  -supine bridge with eccentric lowering:  bilat x 10 reps, SL with leg crossed 2x10 reps bilat  -S/L clams with RTB 2x10 reps bilat  -standing hip abd AROM x 10 reps bilat and with RTB x 10 reps bilat  -Assessed response to exercises/Rx -Reviewed HEP -See below for pt education   -Manual Therapy: -rolling to bilat prox to mid HS and glute in prone to improve pain and soft tissue tightness and reduce myofascial adhesions and restrictions.   PATIENT EDUCATION: Education  details: Answered Pt's questions.  Educated pt in [place] and dx.  Reviewed HEP. Instructed pt to cont with HEP.      Person educated: Patient Education method: Explanation, Demonstration Education comprehension: verbalized understanding and returned demonstration   HOME EXERCISE PROGRAM: Access Code: [email or web address] URL: [email or web address]     ASSESSMENT:  CLINICAL IMPRESSION: Pt continues to have L shoulder pain and bilat glute/prox HS pain though improved overall.  She cont to have HS/glute pain with sitting activities though has improved.  Progressed LE exercises today for improved strength, pain and function.  Pt is improving with strength as evidenced by performance and tolerance of progressed exercises.  Pt responded well to Rx and reports improved sx's with manual technique of rolling.  Patient should benefit from continued skilled PT to address impairments of both L shoulder and bilat hips/HS, address goals, and to restore PLOF.   Objective impairments include decreased activity tolerance, decreased ROM, decreased strength, impaired UE functional use, and pain. These impairments are limiting patient from driving and workout activities, and dressing .      GOALS:   SHORT TERM GOALS:  STG Name Target Date Goal status  1 Pt will be independent and compliant with HEP for improved pain, strength, and function. Baseline:  06/21/2021 GOAL MET  2 Pt will report at least a 25% improvement in pain with sitting activities. Baseline:  06/21/2021 GOAL MET  3 Pt's worst pain will be no > than 6/10 for improved pain overall. Baseline: 06/21/2021 GOAL MET  4 Pt will report at least a 25% improvement in pain and sx's with reaching and daily shoulder mobility Baseline: 08/06/2021 GOAL MET  5 Pt will be able to turn steering wheel without significant shoulder pain Baseline: 08/13/2021 INITIAL  LONG TERM GOALS:   LTG Name Target Date Goal status  1 Pt will be able to  perform her normal seated activities without significant pain. Baseline: 07/13/2021 ONGOING       2 Pt will demo improved bilat hip extension strength and R hip ER strength to 5/5 MMT for improved tolerance with daily mobility and recreational/workout activities.  Baseline: 07/13/2021 PARTIALLY MET  3 Pt will be able to perform yoga without adverse effects Baseline: 07/13/2021 INITIAL  4 Pt will demo improved L shoulder strength to 5/5 MMT except 4/5 in abd throughout for improved performance of daily activities, reaching, and lifting.  Baseline: 08/20/2021 ONGOING  5 Pt will report she is able to perform her normal overhead activities without significant pain or difficulty.   Baseline: 08/20/2021 PARTIALLY MET  6 Pt will be able to reach behind her back to don/doff bra without significant pain  Baseline: 08/20/2021 INITIAL   PLAN: PT FREQUENCY: 2x/week  PT DURATION: 4 weeks  PLANNED INTERVENTIONS:  Therapeutic exercises, Therapeutic activity, Neuro Muscular re-education, Patient/Family education, Joint mobilization, Aquatic Therapy, Electrical stimulation, Cryotherapy, Moist heat, Ultrasound, and Manual therapy  PLAN FOR NEXT SESSION:  Cont with STM/manual therapy to hips and HS.   Cont with hip strengthening and scapular stabilization for L shoulder.  Cont with shoulder PROM.      Selinda Michaels III PT, DPT 07/23/21 9:36 PM

## 2021-07-24 ENCOUNTER — Ambulatory Visit
Admission: RE | Admit: 2021-07-24 | Discharge: 2021-07-24 | Disposition: A | Discharge status: Home / Self Care | Payer: Medicare Other | Source: Ambulatory Visit | Attending: Sports Medicine | Admitting: Sports Medicine

## 2021-07-24 ENCOUNTER — Ambulatory Visit: Payer: Medicare Other | Admitting: Sports Medicine

## 2021-07-24 VITALS — BP 134/68 | Ht 64.5 in | Wt 145.0 lb

## 2021-07-24 DIAGNOSIS — M76899 Other specified enthesopathies of unspecified lower limb, excluding foot: Secondary | ICD-10-CM

## 2021-07-24 DIAGNOSIS — M79602 Pain in left arm: Secondary | ICD-10-CM | POA: Diagnosis not present

## 2021-07-24 NOTE — Progress Notes (Addendum)
SUBJECTIVE:   CHIEF COMPLAINT / HPI:   Follow-up-bilateral ischial tuberosity bursitis/proximal hamstring tendinitis: 75 year old female presenting for the above.  At last appointment on 10/11 she was given a 6-day Pred Dosepak for pain of both hips as well as her left shoulder pain.  She was recommended to continue with physical therapy for both shoulders and her hips with follow-up appointment scheduled at this time. Today she states her pain has improved but she still continues to have some pain with sitting.  She states she has been able to return to yoga and is able to do this as long as she eases into the movements.  She denies any pain with ambulating.  States that the pain is equal bilaterally and does not radiate.  She thinks that physical therapy has improved it.  She states the worst part of her pain is when she is sitting for long peers of time.  She did get a cushion for her car but often does not have a cushion available when she is sitting in other chairs/seats.    Follow up - Left shoulder pain: Previous x-ray of the left humerus showed no acute fracture or other focal bony lesions.  Today she states this has improved with physical therapy.  Is still present but the pain at the posterior aspect of her shoulder is no longer present.  The current pain that she has is on the anterior aspect of the shoulder near the bicep.  She denies any further injuries to it.  OBJECTIVE:   BP 134/68   Ht 5' 4.5" (1.638 m)   Wt 145 lb (65.8 kg)   BMI 24.50 kg/m    General: NAD, pleasant, able to participate in exam MSK: Hips: Pain with palpation at the posterior proximal hip near the ischial tuberosity.  Pain is mildly exacerbated when stretching the hamstring muscles.  Strength 5/5 bilaterally in hip flexion, knee flexion, knee extension. Left shoulder: No visible erythema, lesions, obvious bony prominences.  No pain with palpation of the anterior posterior shoulder.  Normal range of motion  of the shoulder.  No pain with palpation of the proximal biceps tendon, body of the biceps, or distal biceps.  Mild discomfort with internal rotation.  Negative empty can test, negative Yergason's.  ASSESSMENT/PLAN:   Bilateral ischial tuberosity bursitis/proximal hamstring tendinitis: Is improving with physical therapy.  We will get a pelvic x-ray to evaluate for any other cause of the discomfort.  Recommended using a cushion when seated to remove some of the stress from the ischial tuberosity bilaterally.  We will continue physical therapy and follow-up in 4 weeks.  Left shoulder pain: Improved from last appointment.  Previous x-ray with no acute abnormality.  Normal strength with good range of motion and only mild discomfort with internal rotation.  Negative Yergason's, empty can test.  We will continue with physical therapy.  Follow-up in 4 weeks.  Nichole [name], [place]    Patient seen and evaluated with the resident.  I agree with the above plan of care.  Nichole [name] is making slow but steady progress with physical therapy in regards to both hips and her left shoulder.  Pain in her hips is present primarily with sitting.  I recommended that she continue to try to use a well-padded cushion when possible.  I think she should continue with physical therapy until ready for home exercise program per the therapist discretion.  I will also check an AP pelvis x-ray just  to exclude bony causes for her ischial tuberosity pain.   Her left shoulder demonstrates no weakness and full range of motion.  I think she should continue with physical therapy for that as well and if symptoms persist or worsen then consider merits of cortisone injection.  At this point we will leave things open-ended for her to follow-up as needed.   Addendum: X-rays reviewed.  No evidence of bony abnormality at the ischial tuberosity.

## 2021-07-27 ENCOUNTER — Other Ambulatory Visit: Payer: Self-pay

## 2021-07-27 ENCOUNTER — Ambulatory Visit (HOSPITAL_BASED_OUTPATIENT_CLINIC_OR_DEPARTMENT_OTHER): Payer: Medicare Other | Admitting: Physical Therapy

## 2021-07-27 ENCOUNTER — Encounter (HOSPITAL_BASED_OUTPATIENT_CLINIC_OR_DEPARTMENT_OTHER): Payer: Self-pay | Admitting: Physical Therapy

## 2021-07-27 DIAGNOSIS — M25551 Pain in right hip: Secondary | ICD-10-CM

## 2021-07-27 DIAGNOSIS — M25512 Pain in left shoulder: Secondary | ICD-10-CM

## 2021-07-27 DIAGNOSIS — M6281 Muscle weakness (generalized): Secondary | ICD-10-CM

## 2021-07-27 DIAGNOSIS — M25552 Pain in left hip: Secondary | ICD-10-CM

## 2021-07-27 NOTE — Therapy (Signed)
OUTPATIENT PHYSICAL THERAPY TREATMENT      Patient Name: Nichole [name] MRN: [id number] DOB:1946-02-09, 75 y.o., female Today's Date: 07/27/2021   PT End of Session - 07/27/21 1250     Visit Number 10    Number of Visits 16    Date for PT Re-Evaluation 08/15/21    Authorization Type [employer] Medicare    PT Start Time 1237    PT Stop Time 1319    PT Time Calculation (min) 42 min    Activity Tolerance Patient tolerated treatment well    Behavior During Therapy [facility] for tasks assessed/performed               History reviewed. No pertinent past medical history. History reviewed. No pertinent surgical history. There are no problems to display for this patient.   PCP: Lajean Manes, MD  REFERRING PROVIDER: Thurman Coyer, DO  REFERRING DIAG: Bilat HS tendonitis                                  L RTC tendonitis  THERAPY DIAG:  Pain in Right Hip Pain in Left shoulder Muscle Weakness Pain in Left Hip    ONSET DATE: 05/09/2021  SUBJECTIVE:                                                                                                                                                                                      SUBJECTIVE STATEMENT: -Pt states her leg/glute and shoulder pain are improving.  She continues to have pain in L biceps.  Pt had a melanoma spot on her R shoulder removed last week and had stitches removed.  Pt reports 2/10 shoulder pain with driving, and also has glute/HS pain with driving/sitting in car.  Pt has HS/glute pain with bending.  Pt reports improved mobility in L shoulder.  Pt has improved with reaching overhead though has difficulty reaching behind back.  Has difficulty with donning/doffing bra though has improved with reaching.  If Pt moves very slow with L UE, she has less pain.  Pt has shoulder pain with quick movements.  Pain with turning steering wheel.    -Pt found a new cushion to use for sitting in the car which seems to help.  Pt denies  any adverse effects after prior Rx.  Pt reports compliance with HEP.  Pt saw MD who took x rays of pelvis and Pt states they showed no fractures but does have OA at SI joint.  MD informed pt it is going to take time.  Pt returns to MD in 1 month and will  get an injection if she is still having shoulder pain.  Pt reports her shoulder has been doing better until today probably due to the weather.  Pt states she was able to reach back for the seatbelt and able to don/doff her bra.  Pt states she was a little sore in her glutes after prior Rx though felt more soreness when performing bridge at home.  She felt better when she performed it the following day.    PERTINENT HISTORY: Prior R hip/piriformis pain.  Osteopenia, Prior biceps tear on R and R shoulder surgery approx 20 years.   PAIN:  Pt has no shoulder pain at rest and no glute/HS pain with standing or walking.  2-3/10 pain in glute/prox HS pain with sitting. Pain location:  L biceps and post L shoulder pain.  Bilat glute/prox HS      PATIENT GOALS reduce popping, improved mobility without pain.  To be able turn the steering wheel.  Reaching for her bra  OBJECTIVE:   Pt is R hand dominant    Therapeutic Exercise:  -Reviewed current function, functional improvement/deficits, pain levels, HEP compliance, and response to prior Rx. -Reviewed HEP. -Pt performed:  -supine bridge with eccentric lowering:  bilat x 10 reps, SL with leg crossed 2x10 reps bilat  -S/L clams with RTB 2x10 reps bilat  -standing hip abd 2 x 10 reps bilat with RTB     -supine shoulder ABC x 1 rep with 2#           -supine rhythmic stab's at 90 deg flexion 3x30 sec            -prone row 2x10 with 1#           -prone extension to neutral 2x10 reps  -Assessed response to exercises/Rx -Reviewed HEP -See below for pt education   -Manual Therapy: -rolling to bilat prox to mid HS and glute in prone to improve pain and soft tissue tightness and reduce myofascial  adhesions and restrictions.   PATIENT EDUCATION: Education details: Answered Pt's questions.  Educated pt in [place] and dx.  Exercise form. Instructed pt to cont with HEP.      Person educated: Patient Education method: Explanation, Demonstration, verbal and tactile cues Education comprehension: verbalized understanding and returned demonstration, verbal and tactile cues required   HOME EXERCISE PROGRAM: Access Code: [email or web address] URL: [email or web address]     ASSESSMENT:  CLINICAL IMPRESSION: Pt saw MD who wants her to continue with PT.  Pt had x rays which were negative for fx.  She continues to have L shoulder pain and bilat glute/prox HS pain though is improving.  She cont to have HS/glute pain with sitting activities though has improved.  Pt is trying a new cushion in the care with sitting.  Pt performed exercises well with cuing for correct form.  Pt unable to perform prone shoulder extension with 1#.   Pt reports improved reaching with L UE.  Pt responded well to Rx having no c/o's after Rx.  She reports improved sx's with manual technique of rolling and states it feels so good after rolling.  Patient should benefit from continued skilled PT to address impairments of both L shoulder and bilat hips/HS, address goals, and to restore PLOF.    Objective impairments include decreased activity tolerance, decreased ROM, decreased strength, impaired UE functional use, and pain. These impairments are limiting patient from driving and workout activities, and dressing .      GOALS:   SHORT TERM GOALS:  STG Name Target Date Goal status  1 Pt will be independent and compliant with HEP for improved pain, strength, and function. Baseline:  06/21/2021 GOAL MET  2 Pt will report at least a 25% improvement in pain with sitting activities. Baseline:  06/21/2021 GOAL MET  3 Pt's worst pain will be no > than 6/10 for improved pain overall. Baseline: 06/21/2021 GOAL MET  4 Pt will report  at least a 25% improvement in pain and sx's with reaching and daily shoulder mobility Baseline: 08/10/2021 GOAL MET  5 Pt will be able to turn steering wheel without significant shoulder pain Baseline: 08/17/2021 INITIAL             LONG TERM GOALS:   LTG Name Target Date Goal status  1 Pt will be able to perform her normal seated activities without significant pain. Baseline: 07/13/2021 ONGOING       2 Pt will demo improved bilat hip extension strength and R hip ER strength to 5/5 MMT for improved tolerance with daily mobility and recreational/workout activities.  Baseline: 07/13/2021 PARTIALLY MET  3 Pt will be able to perform yoga without adverse effects Baseline: 07/13/2021 INITIAL  4 Pt will demo improved L shoulder strength to 5/5 MMT except 4/5 in abd throughout for improved performance of daily activities, reaching, and lifting.  Baseline: 08/24/2021 ONGOING  5 Pt will report she is able to perform her normal overhead activities without significant pain or difficulty.   Baseline: 08/24/2021 PARTIALLY MET  6 Pt will be able to reach behind her back to don/doff bra without significant pain  Baseline: 08/24/2021 INITIAL   PLAN: PT FREQUENCY: 2x/week  PT DURATION: 4 weeks  PLANNED INTERVENTIONS:  Therapeutic exercises, Therapeutic activity, Neuro Muscular re-education, Patient/Family education, Joint mobilization, Aquatic Therapy, Electrical stimulation, Cryotherapy, Moist heat, Ultrasound, and Manual therapy  PLAN FOR NEXT SESSION:  Cont with STM/manual therapy to hips and HS.   Cont with hip strengthening and scapular stabilization for L shoulder.  Cont with shoulder PROM.      Selinda Michaels III PT, DPT 07/27/21 3:14 PM

## 2021-07-30 ENCOUNTER — Encounter (HOSPITAL_BASED_OUTPATIENT_CLINIC_OR_DEPARTMENT_OTHER): Payer: Self-pay | Admitting: Physical Therapy

## 2021-07-30 ENCOUNTER — Other Ambulatory Visit: Payer: Self-pay

## 2021-07-30 ENCOUNTER — Ambulatory Visit (HOSPITAL_BASED_OUTPATIENT_CLINIC_OR_DEPARTMENT_OTHER): Payer: Medicare Other | Admitting: Physical Therapy

## 2021-07-30 DIAGNOSIS — M25551 Pain in right hip: Secondary | ICD-10-CM

## 2021-07-30 DIAGNOSIS — M6281 Muscle weakness (generalized): Secondary | ICD-10-CM

## 2021-07-30 DIAGNOSIS — M25512 Pain in left shoulder: Secondary | ICD-10-CM

## 2021-07-30 DIAGNOSIS — M25552 Pain in left hip: Secondary | ICD-10-CM

## 2021-07-30 NOTE — Therapy (Signed)
OUTPATIENT PHYSICAL THERAPY TREATMENT      Patient Name: Nichole [name] MRN: [id number] DOB:[birth year], 75 y.o., female Today's Date: 07/30/2021   PT End of Session - 07/30/21 1535     Visit Number 11    Number of Visits 16    Date for PT Re-Evaluation 08/15/21    PT Start Time 1525    PT Stop Time 1607    PT Time Calculation (min) 42 min    Activity Tolerance Patient tolerated treatment well    Behavior During Therapy [facility] for tasks assessed/performed                History reviewed. No pertinent past medical history. History reviewed. No pertinent surgical history. There are no problems to display for this patient.   PCP: Lajean Manes, MD  REFERRING PROVIDER: Thurman Coyer, DO  REFERRING DIAG: Bilat HS tendonitis                                  L RTC tendonitis  THERAPY DIAG:  Pain in Right Hip Pain in Left shoulder Muscle Weakness Pain in Left Hip    ONSET DATE: 05/09/2021  SUBJECTIVE:                                                                                                                                                                                      SUBJECTIVE STATEMENT: -Pt found a new cushion to use for sitting in the car which seems to help.  Pt denies any adverse effects after prior Rx.  Pt reports compliance with HEP.  Pt saw MD who took x rays of pelvis and Pt states they showed no fractures but does have OA at SI joint.  MD informed pt it is going to take time.  Pt returns to MD in 1 month and will get an injection if she is still having shoulder pain.   -She continues to have pain in L biceps. If Pt moves very slow with L UE, she has less pain.  Pain with turning steering wheel.  Pt states the new pillow in her car didn't help her pain.  Pt's husband rolled her leg/glute at home which felt good.  Pt reports compliance with HEP including performing her band exercise.  Pt states her L shoulder is feeling better.  Pt did light yoga  before she came here today.  Pt did a regular yoga class on Saturday and reports she was careful but felt fine.  Pt denies any adverse effects after prior Rx.      PERTINENT HISTORY: Prior R  hip/piriformis pain.  Osteopenia, Prior biceps tear on R and R shoulder surgery approx 20 years.   PAIN:  Pt has no shoulder pain at rest and no glute/HS pain with standing or walking.  3-4/10 pain in glute/prox HS pain with sitting in car.  She has no pain in glute/HS when standing walking or standing.   Pain location:  L biceps and post L shoulder pain.  Bilat glute/prox HS      PATIENT GOALS reduce popping, improved mobility without pain.  To be able turn the steering wheel.  Reaching for her bra  OBJECTIVE:   Pt is R hand dominant    Therapeutic Exercise:  -Reviewed current function, functional improvement/deficits, pain levels, HEP compliance, and response to prior Rx. -Reviewed HEP. -Pt performed:  -supine bridge with eccentric lowering:  SL with leg crossed 2x10 reps bilat  -S/L clams with RTB 2x10 reps bilat  -standing hip abd 2 x 10 reps bilat with RTB   -mini squats to table 2x10 reps -supine supine serratus punch with 2# 2x10 reps -supine shoulder ABC x 1 rep with 2# -S/L ER 2x10 reps with 1#           -supine rhythmic stab's at 90 deg flexion 3x30 sec            -prone row 2x10 with 1#           -prone extension to neutral approx 7 reps with 1# and 10 reps without weight  -Assessed response to exercises/Rx -Reviewed HEP -See below for pt education   -Manual Therapy: -rolling to bilat prox to mid HS and glute in prone to improve pain and soft tissue tightness and reduce myofascial adhesions and restrictions.   PATIENT EDUCATION: Education details: Answered Pt's questions.  Educated pt in [place] and dx.  Exercise form. Instructed pt to cont with HEP.      Person educated: Patient Education method: Explanation, Demonstration, verbal and tactile cues Education comprehension:  verbalized understanding and returned demonstration, verbal and tactile cues required   HOME EXERCISE PROGRAM: Access Code: [email or web address] URL: [email or web address]     ASSESSMENT:  CLINICAL IMPRESSION: She continues to have L shoulder pain and bilat glute/prox HS pain though is improving.  She cont to have HS/glute pain with sitting activities and has not found a good cushion to sit on in her car.  Pt performed exercises well with cuing for correct form and has improved tolerance with LE exercises.  Pt continues to have difficulty performing and limited ROM with prone shoulder extension with 1#.   Pt responded well to Rx stating her legs feel better after rolling. Patient should benefit from continued skilled PT to address impairments of both L shoulder and bilat hips/HS, address goals, and to restore PLOF.    Objective impairments include decreased activity tolerance, decreased ROM, decreased strength, impaired UE functional use, and pain. These impairments are limiting patient from driving and workout activities, and dressing .      GOALS:   SHORT TERM GOALS:  STG Name Target Date Goal status  1 Pt will be independent and compliant with HEP for improved pain, strength, and function. Baseline:  06/21/2021 GOAL MET  2 Pt will report at least a 25% improvement in pain with sitting activities. Baseline:  06/21/2021 GOAL MET  3 Pt's worst pain will be no > than 6/10 for improved pain overall. Baseline: 06/21/2021 GOAL MET  4 Pt will report at least a 25% improvement in pain and sx's with  reaching and daily shoulder mobility Baseline: 08/13/2021 GOAL MET  5 Pt will be able to turn steering wheel without significant shoulder pain Baseline: 08/20/2021 INITIAL             LONG TERM GOALS:   LTG Name Target Date Goal status  1 Pt will be able to perform her normal seated activities without significant pain. Baseline: 07/13/2021 ONGOING       2 Pt will demo improved bilat  hip extension strength and R hip ER strength to 5/5 MMT for improved tolerance with daily mobility and recreational/workout activities.  Baseline: 07/13/2021 PARTIALLY MET  3 Pt will be able to perform yoga without adverse effects Baseline: 07/13/2021 INITIAL  4 Pt will demo improved L shoulder strength to 5/5 MMT except 4/5 in abd throughout for improved performance of daily activities, reaching, and lifting.  Baseline: 08/27/2021 ONGOING  5 Pt will report she is able to perform her normal overhead activities without significant pain or difficulty.   Baseline: 08/27/2021 PARTIALLY MET  6 Pt will be able to reach behind her back to don/doff bra without significant pain  Baseline: 08/27/2021 INITIAL   PLAN: PT FREQUENCY: 2x/week  PT DURATION: 4 weeks  PLANNED INTERVENTIONS:  Therapeutic exercises, Therapeutic activity, Neuro Muscular re-education, Patient/Family education, Joint mobilization, Aquatic Therapy, Electrical stimulation, Cryotherapy, Moist heat, Ultrasound, and Manual therapy  PLAN FOR NEXT SESSION:  Cont with STM/manual therapy to hips and HS.   Cont with hip strengthening and scapular stabilization for L shoulder.  Cont with shoulder PROM.      Selinda Michaels III PT, DPT 07/30/21 9:26 PM

## 2021-08-02 ENCOUNTER — Ambulatory Visit (HOSPITAL_BASED_OUTPATIENT_CLINIC_OR_DEPARTMENT_OTHER): Payer: Medicare Other | Admitting: Physical Therapy

## 2021-08-02 ENCOUNTER — Encounter (HOSPITAL_BASED_OUTPATIENT_CLINIC_OR_DEPARTMENT_OTHER): Payer: Self-pay | Admitting: Physical Therapy

## 2021-08-02 ENCOUNTER — Other Ambulatory Visit: Payer: Self-pay

## 2021-08-02 DIAGNOSIS — M25552 Pain in left hip: Secondary | ICD-10-CM

## 2021-08-02 DIAGNOSIS — M25551 Pain in right hip: Secondary | ICD-10-CM | POA: Diagnosis not present

## 2021-08-02 DIAGNOSIS — M6281 Muscle weakness (generalized): Secondary | ICD-10-CM

## 2021-08-02 DIAGNOSIS — M25512 Pain in left shoulder: Secondary | ICD-10-CM

## 2021-08-02 NOTE — Therapy (Signed)
OUTPATIENT PHYSICAL THERAPY TREATMENT      Patient Name: Nichole [name] MRN: [id number] DOB:August 16, 1946, 75 y.o., female Today's Date: 08/02/2021   PT End of Session - 08/02/21 1133     Visit Number 12    Number of Visits 16    Date for PT Re-Evaluation 08/15/21    Authorization Type [employer] Medicare    PT Start Time 1105    PT Stop Time 1148    PT Time Calculation (min) 43 min    Activity Tolerance Patient tolerated treatment well    Behavior During Therapy [facility] for tasks assessed/performed                 History reviewed. No pertinent past medical history. History reviewed. No pertinent surgical history. There are no problems to display for this patient.   PCP: Lajean Manes, MD  REFERRING PROVIDER: Thurman Coyer, DO  REFERRING DIAG: Bilat HS tendonitis                                  L RTC tendonitis  THERAPY DIAG:  Pain in Right Hip Pain in Left shoulder Muscle Weakness Pain in Left Hip    ONSET DATE: 05/09/2021  SUBJECTIVE:                                                                                                                                                                                      SUBJECTIVE STATEMENT: -Pt saw MD who took x rays of pelvis and Pt states they showed no fractures but does have OA at SI joint.  MD informed pt it is going to take time.  Pt returns to MD in 1 month and will get an injection if she is still having shoulder pain.   -She reports compliance with HEP.  Pt denies any adverse effects after prior Rx.  Pt has put a prayer shawl in her seat in the car to sit on and feels better.  Pt states her glute/HS pain is improving and her husband has been rolling her glutes/HS.  Pt states she did bridging in her yoga class yesterday and it felt the best it has.  Pt reports improved reaching behind back.  Pt states "I feel great"  PERTINENT HISTORY: Prior R hip/piriformis pain.  Osteopenia, Prior biceps tear on R and R  shoulder surgery approx 20 years.   PAIN:  Pt states she doesn't really have any pain today.   Pain location:  L biceps and post L shoulder pain.  Bilat glute/prox HS      PATIENT GOALS reduce popping,  improved mobility without pain.  To be able turn the steering wheel.  Reaching for her bra  OBJECTIVE:   Pt is R hand dominant    Therapeutic Exercise:  -Reviewed current function, functional improvement/deficits, pain levels, HEP compliance, and response to prior Rx. -Reviewed HEP. -Pt performed:  -supine bridge with eccentric lowering:  SL with leg crossed 2x10 reps bilat.  Foot further away from hip to lengthen HS  -S/L clams with RTB 2x10 reps bilat  -standing hip abd 2 x 10 reps bilat with RTB   -mini squats to table 2x10 reps -supine supine serratus punch with 2# 2x10 reps -supine shoulder ABC x 1 rep with 2# -S/L ER 2x10 reps with 1#           -supine rhythmic stab's at 60/90/120 deg flexion x30 sec each            -prone row 2x10 with 1#           -4D ball rolls on wall at 90 deg flexion x 10 reps each  -Assessed response to exercises/Rx -See below for pt education   -Manual Therapy: -rolling to bilat prox to mid HS and glute in prone to improve pain and soft tissue tightness and reduce myofascial adhesions and restrictions.   PATIENT EDUCATION: Education details: Answered Pt's questions.  Educated pt in [place] and dx.  Exercise form. Instructed pt to cont with HEP.      Person educated: Patient Education method: Explanation, Demonstration, verbal and tactile cues Education comprehension: verbalized understanding and returned demonstration, verbal and tactile cues required   HOME EXERCISE PROGRAM: Access Code: [email or web address] URL: [email or web address]     ASSESSMENT:  CLINICAL IMPRESSION: Pt is progressing well with pain and sx's and reports she is feeling better since she was last seen.  Pt reports improved reaching behind back and demonstrates good  L shoulder AROM with reaching behind her back.  Progressed bridging today with longer lever and she did well not having increased pain.  Pt responded well to Rx having no c/o's and states she feels good after rolling.  She states she can't feel anything in her HS after rolling.  Patient should benefit from continued skilled PT to address impairments of both L shoulder and bilat hips/HS, address goals, and to restore PLOF.   Objective impairments include decreased activity tolerance, decreased ROM, decreased strength, impaired UE functional use, and pain. These impairments are limiting patient from driving and workout activities, and dressing .      GOALS:   SHORT TERM GOALS:  STG Name Target Date Goal status  1 Pt will be independent and compliant with HEP for improved pain, strength, and function. Baseline:  06/21/2021 GOAL MET  2 Pt will report at least a 25% improvement in pain with sitting activities. Baseline:  06/21/2021 GOAL MET  3 Pt's worst pain will be no > than 6/10 for improved pain overall. Baseline: 06/21/2021 GOAL MET  4 Pt will report at least a 25% improvement in pain and sx's with reaching and daily shoulder mobility Baseline: 08/16/2021 GOAL MET  5 Pt will be able to turn steering wheel without significant shoulder pain Baseline: 08/23/2021 INITIAL             LONG TERM GOALS:   LTG Name Target Date Goal status  1 Pt will be able to perform her normal seated activities without significant pain. Baseline: 07/13/2021 ONGOING       2 Pt will demo improved bilat hip extension  strength and R hip ER strength to 5/5 MMT for improved tolerance with daily mobility and recreational/workout activities.  Baseline: 07/13/2021 PARTIALLY MET  3 Pt will be able to perform yoga without adverse effects Baseline: 07/13/2021 INITIAL  4 Pt will demo improved L shoulder strength to 5/5 MMT except 4/5 in abd throughout for improved performance of daily activities, reaching, and lifting.   Baseline: 08/30/2021 ONGOING  5 Pt will report she is able to perform her normal overhead activities without significant pain or difficulty.   Baseline: 08/30/2021 PARTIALLY MET  6 Pt will be able to reach behind her back to don/doff bra without significant pain  Baseline: 08/30/2021 INITIAL   PLAN: PT FREQUENCY: 2x/week  PT DURATION: 4 weeks  PLANNED INTERVENTIONS:  Therapeutic exercises, Therapeutic activity, Neuro Muscular re-education, Patient/Family education, Joint mobilization, Aquatic Therapy, Electrical stimulation, Cryotherapy, Moist heat, Ultrasound, and Manual therapy  PLAN FOR NEXT SESSION:  Cont with STM/manual therapy to hips and HS.   Cont with hip strengthening and scapular stabilization for L shoulder.  Cont with shoulder PROM.     Selinda Michaels III PT, DPT 08/02/21 5:07 PM

## 2021-08-07 ENCOUNTER — Encounter (HOSPITAL_BASED_OUTPATIENT_CLINIC_OR_DEPARTMENT_OTHER): Payer: Self-pay | Admitting: Physical Therapy

## 2021-08-07 ENCOUNTER — Ambulatory Visit (HOSPITAL_BASED_OUTPATIENT_CLINIC_OR_DEPARTMENT_OTHER): Payer: Medicare Other | Admitting: Physical Therapy

## 2021-08-07 ENCOUNTER — Other Ambulatory Visit: Payer: Self-pay

## 2021-08-07 DIAGNOSIS — M25551 Pain in right hip: Secondary | ICD-10-CM | POA: Diagnosis not present

## 2021-08-07 DIAGNOSIS — M25512 Pain in left shoulder: Secondary | ICD-10-CM

## 2021-08-07 DIAGNOSIS — M25552 Pain in left hip: Secondary | ICD-10-CM

## 2021-08-07 DIAGNOSIS — M6281 Muscle weakness (generalized): Secondary | ICD-10-CM

## 2021-08-07 NOTE — Therapy (Signed)
OUTPATIENT PHYSICAL THERAPY TREATMENT      Patient Name: Nichole [name] MRN: [id number] DOB:[birth year], 75 y.o., female Today's Date: 08/07/2021   PT End of Session - 08/07/21 0956     Visit Number 13    Number of Visits 16    Date for PT Re-Evaluation 08/15/21    Authorization Type [employer] Medicare    PT Start Time [phone]    PT Stop Time 1023    PT Time Calculation (min) 42 min    Activity Tolerance Patient tolerated treatment well    Behavior During Therapy [facility] for tasks assessed/performed                  History reviewed. No pertinent past medical history. History reviewed. No pertinent surgical history. There are no problems to display for this patient.   PCP: Lajean Manes, MD  REFERRING PROVIDER: Thurman Coyer, DO  REFERRING DIAG: Bilat HS tendonitis                                  L RTC tendonitis  THERAPY DIAG:  Pain in Right Hip Pain in Left shoulder Muscle Weakness Pain in Left Hip    ONSET DATE: 05/09/2021  SUBJECTIVE:                                                                                                                                                                                      SUBJECTIVE STATEMENT: -She reports compliance with HEP.  Pt denies any adverse effects after prior Rx.  Pt has put a prayer shawl in her seat in the car to sit on and feels better.  Pt states her glute/HS pain is improving and her husband has been rolling her glutes/HS.  -"It is much better" (in reference to her shoulder).  Pt states her shoulder is doing much better.  She was able to reach behind her back to don bra and to reach seat belt.  Pt states she feels her HS/Hip pain is improving but then she has pain sitting in car.   -Pt tried voltaren gel for 2 days on HS/glute.  Pt hasn't been taking Aleve and doesn't uses as much biofreeze.    PERTINENT HISTORY: Prior R hip/piriformis pain.  Osteopenia, Prior biceps tear on R and R shoulder surgery  approx 20 years.   PAIN:  Pt states she doesn't really have any pain today.   Pain location:  L biceps and post L shoulder pain.  Bilat glute/prox HS      PATIENT GOALS reduce popping, improved mobility without pain.  To  be able turn the steering wheel.  Reaching for her bra  OBJECTIVE:   Pt is R hand dominant    Therapeutic Exercise:  -Reviewed current function, functional improvement/deficits, pain levels, HEP compliance, and response to prior Rx. -Reviewed HEP. -Pt performed:  -supine bridge with eccentric lowering:  SL with leg crossed 2x10 reps bilat.  Foot further away from hip to lengthen HS  -S/L clams with RTB 2x10 reps bilat  -lateral band walks with RTB around thighs 2 x 10-12 reps    -mini squats to table 2x10 reps -supine supine serratus punch with 2# 2x10 reps -supine shoulder ABC x 1 rep with 2# -S/L ER 2x10 reps with 1#           -supine rhythmic stab's at 60/90/120 deg flexion x30 sec each            -prone row x10 with 1# and x 10 reps with 2#           -4D ball rolls on wall at 90 deg flexion x 10 reps each  -Assessed response to exercises/Rx -See below for pt education   -Manual Therapy: -rolling to bilat prox to mid HS and glute in prone to improve pain and soft tissue tightness and reduce myofascial adhesions and restrictions.   PATIENT EDUCATION: Education details: Answered Pt's questions.  Educated pt in [place] and dx.  Exercise form. Instructed pt to cont with HEP.  Exercise form Person educated: Patient Education method: Explanation, Demonstration, verbal and tactile cues Education comprehension: verbalized understanding and returned demonstration, verbal and tactile cues required   HOME EXERCISE PROGRAM: Access Code: [email or web address] URL: [email or web address]     ASSESSMENT:  CLINICAL IMPRESSION: Pt is improving with pain and sx's in L shoulder and bilat hips/HS.  Pt reports she is able to reach behind her back for functional  activities with reduced pain and greater ease.  Pt demonstrated good L shoulder AROM with reaching behind her back.  Pt hasn't been using aleve and not using as much biofreeze lately.  She continues to have pain with sitting.  Pt performed exercises well without c/o's.  Pt required assistance for correct form with prone row.  Increased resistance with prone row though subsequently decreased resistance to improve form. Pt responded well to Rx having no c/o's and states she feels good after rolling.  Patient should benefit from continued skilled PT to address impairments of both L shoulder and bilat hips/HS, address goals, and to restore PLOF.      Objective impairments include decreased activity tolerance, decreased ROM, decreased strength, impaired UE functional use, and pain. These impairments are limiting patient from driving and workout activities, and dressing .      GOALS:   SHORT TERM GOALS:  STG Name Target Date Goal status  1 Pt will be independent and compliant with HEP for improved pain, strength, and function. Baseline:  06/21/2021 GOAL MET  2 Pt will report at least a 25% improvement in pain with sitting activities. Baseline:  06/21/2021 GOAL MET  3 Pt's worst pain will be no > than 6/10 for improved pain overall. Baseline: 06/21/2021 GOAL MET  4 Pt will report at least a 25% improvement in pain and sx's with reaching and daily shoulder mobility Baseline: 08/21/2021 GOAL MET  5 Pt will be able to turn steering wheel without significant shoulder pain Baseline: 08/28/2021 INITIAL             LONG TERM GOALS:   LTG Name Target Date  Goal status  1 Pt will be able to perform her normal seated activities without significant pain. Baseline: 07/13/2021 ONGOING       2 Pt will demo improved bilat hip extension strength and R hip ER strength to 5/5 MMT for improved tolerance with daily mobility and recreational/workout activities.  Baseline: 07/13/2021 PARTIALLY MET  3 Pt will be  able to perform yoga without adverse effects Baseline: 07/13/2021 INITIAL  4 Pt will demo improved L shoulder strength to 5/5 MMT except 4/5 in abd throughout for improved performance of daily activities, reaching, and lifting.  Baseline: 09/04/2021 ONGOING  5 Pt will report she is able to perform her normal overhead activities without significant pain or difficulty.   Baseline: 09/04/2021 PARTIALLY MET  6 Pt will be able to reach behind her back to don/doff bra without significant pain  Baseline: 09/04/2021 INITIAL   PLAN:   PLANNED INTERVENTIONS:  Therapeutic exercises, Therapeutic activity, Neuro Muscular re-education, Patient/Family education, Joint mobilization, Aquatic Therapy, Electrical stimulation, Cryotherapy, Moist heat, Ultrasound, and Manual therapy  PLAN FOR NEXT SESSION:  Cont with STM/manual therapy to hips and HS.   Cont with hip strengthening and scapular stabilization for L shoulder.  Cont with shoulder PROM.     Selinda Michaels III PT, DPT 08/07/21 10:01 PM

## 2021-08-13 ENCOUNTER — Encounter (HOSPITAL_BASED_OUTPATIENT_CLINIC_OR_DEPARTMENT_OTHER): Payer: Self-pay | Admitting: Physical Therapy

## 2021-08-13 ENCOUNTER — Other Ambulatory Visit: Payer: Self-pay

## 2021-08-13 ENCOUNTER — Ambulatory Visit (HOSPITAL_BASED_OUTPATIENT_CLINIC_OR_DEPARTMENT_OTHER): Payer: Medicare Other | Admitting: Physical Therapy

## 2021-08-13 ENCOUNTER — Other Ambulatory Visit (HOSPITAL_BASED_OUTPATIENT_CLINIC_OR_DEPARTMENT_OTHER): Payer: Self-pay

## 2021-08-13 DIAGNOSIS — M25552 Pain in left hip: Secondary | ICD-10-CM

## 2021-08-13 DIAGNOSIS — M6281 Muscle weakness (generalized): Secondary | ICD-10-CM

## 2021-08-13 DIAGNOSIS — M25551 Pain in right hip: Secondary | ICD-10-CM

## 2021-08-13 DIAGNOSIS — M25512 Pain in left shoulder: Secondary | ICD-10-CM

## 2021-08-14 NOTE — Therapy (Signed)
OUTPATIENT PHYSICAL THERAPY TREATMENT      Patient Name: Nichole [name] MRN: [id number] DOB:1946-02-09, 75 y.o., female Today's Date: 08/14/2021   PT End of Session - 08/13/21 1607     Visit Number 14    Number of Visits 16    Date for PT Re-Evaluation 08/15/21    Authorization Type [employer] Medicare    PT Start Time 7858    PT Stop Time 1615    PT Time Calculation (min) 45 min    Activity Tolerance Patient tolerated treatment well    Behavior During Therapy [facility] for tasks assessed/performed                  History reviewed. No pertinent past medical history. History reviewed. No pertinent surgical history. There are no problems to display for this patient.   PCP: Lajean Manes, MD  REFERRING PROVIDER: Thurman Coyer, DO  REFERRING DIAG: Bilat HS tendonitis                                  L RTC tendonitis  THERAPY DIAG:  Pain in Right Hip Pain in Left shoulder Muscle Weakness Pain in Left Hip    ONSET DATE: 05/09/2021  SUBJECTIVE:                                                                                                                                                                                      SUBJECTIVE STATEMENT: -She reports compliance with HEP.  Pt denies any adverse effects after prior Rx.  Pt states she is using 2 prayer shawls in her seat in the car to sit on.  She continues to have increased pain with sitting.  Her husband has been rolling her glutes/HS consistently.  Pt states her shoulder is doing great.  She was able to reach behind her back to don bra and to reach seat belt.  Pt reports no pain or difficulty with turning steering wheel and driving.   PERTINENT HISTORY: Prior R hip/piriformis pain.  Osteopenia, Prior biceps tear on R and R shoulder surgery approx 20 years.   PAIN:  Pt states she doesn't really have any pain today.   Pain location:  L biceps and post L shoulder pain.  Bilat glute/prox HS      PATIENT GOALS  reduce popping, improved mobility without pain.  To be able turn the steering wheel.  Reaching for her bra  OBJECTIVE:   Pt is R hand dominant    Therapeutic Exercise:  -Reviewed current function, functional improvement/deficits, pain levels, HEP compliance, and response to prior  Rx. -Reviewed HEP. -Pt performed:  -supine bridge with eccentric lowering:  SL with leg crossed 2x10 reps bilat.  Foot further away from hip to lengthen HS  -S/L clams with RTB 2x10 reps bilat  -lateral band walks with RTB around thighs 2 x 10-12 reps   -standing hip abd with RTB 2x10 reps   -mini squats to table 2x10 reps -supine supine serratus punch with 2# 2x10 reps -supine shoulder ABC x 1 rep with 2# -S/L ER x10 reps each with 1# and with 2#           -supine rhythmic stab's at 60/90/120 deg flexion x30 sec each            -prone row x10 with 1# and x 10 reps with 2#           -4D ball rolls on wall at 90 deg flexion x 20 reps each  -Assessed response to exercises/Rx -See below for pt education   -Manual Therapy: -rolling to bilat prox to mid HS and glute in prone to improve pain and soft tissue tightness and reduce myofascial adhesions and restrictions.   PATIENT EDUCATION: Education details:  Educated pt in [place], prognosis, and dx.  Exercise form. Instructed pt to cont with HEP.   Person educated: Patient Education method: Explanation, Demonstration, verbal and tactile cues Education comprehension: verbalized understanding and returned demonstration, verbal and tactile cues required   HOME EXERCISE PROGRAM: Access Code: [email or web address] URL: [email or web address]     ASSESSMENT:  CLINICAL IMPRESSION: Pt is improving with pain and sx's in L shoulder and bilat hips/HS.  She has seen more improvement in shoulder recently.  Pt is able to turn steering wheel and drive without pain or difficulty.  Pt met STG #5.  Pt reports she is able to reach behind her back for functional activities  with reduced pain and greater ease.  She continues to have pain with sitting.  Pt performed exercises well without c/o's.  Pt responded well to Rx having no c/o's and states she feels good after rolling.  Patient should benefit from continued skilled PT to address impairments of both L shoulder and bilat hips/HS, address goals, and to restore PLOF.      Objective impairments include decreased activity tolerance, decreased ROM, decreased strength, impaired UE functional use, and pain. These impairments are limiting patient from driving and workout activities, and dressing .      GOALS:   SHORT TERM GOALS:  STG Name Target Date Goal status  1 Pt will be independent and compliant with HEP for improved pain, strength, and function. Baseline:  06/21/2021 GOAL MET  2 Pt will report at least a 25% improvement in pain with sitting activities. Baseline:  06/21/2021 GOAL MET  3 Pt's worst pain will be no > than 6/10 for improved pain overall. Baseline: 06/21/2021 GOAL MET  4 Pt will report at least a 25% improvement in pain and sx's with reaching and daily shoulder mobility Baseline: 08/28/2021 GOAL MET  5 Pt will be able to turn steering wheel without significant shoulder pain Baseline: 09/04/2021 GOAL MET             LONG TERM GOALS:   LTG Name Target Date Goal status  1 Pt will be able to perform her normal seated activities without significant pain. Baseline: 07/13/2021 ONGOING       2 Pt will demo improved bilat hip extension strength and R hip ER strength to 5/5 MMT for improved tolerance with daily  mobility and recreational/workout activities.  Baseline: 07/13/2021 PARTIALLY MET  3 Pt will be able to perform yoga without adverse effects Baseline: 07/13/2021 INITIAL  4 Pt will demo improved L shoulder strength to 5/5 MMT except 4/5 in abd throughout for improved performance of daily activities, reaching, and lifting.  Baseline: 09/11/2021 ONGOING  5 Pt will report she is able to  perform her normal overhead activities without significant pain or difficulty.   Baseline: 09/11/2021 PARTIALLY MET  6 Pt will be able to reach behind her back to don/doff bra without significant pain  Baseline: 09/11/2021 INITIAL   PLAN:   PLANNED INTERVENTIONS:  Therapeutic exercises, Therapeutic activity, Neuro Muscular re-education, Patient/Family education, Joint mobilization, Aquatic Therapy, Electrical stimulation, Cryotherapy, Moist heat, Ultrasound, and Manual therapy  PLAN FOR NEXT SESSION:  PN vs discharge.  Pt sees MD next week.  Cont with STM/manual therapy to hips and HS.   Cont with hip strengthening and scapular stabilization for L shoulder.      Selinda Michaels III PT, DPT 08/14/21 4:04 PM

## 2021-08-15 ENCOUNTER — Encounter (HOSPITAL_BASED_OUTPATIENT_CLINIC_OR_DEPARTMENT_OTHER): Payer: Self-pay | Admitting: Physical Therapy

## 2021-08-15 ENCOUNTER — Ambulatory Visit (HOSPITAL_BASED_OUTPATIENT_CLINIC_OR_DEPARTMENT_OTHER): Payer: Medicare Other | Admitting: Physical Therapy

## 2021-08-15 ENCOUNTER — Other Ambulatory Visit: Payer: Self-pay

## 2021-08-15 DIAGNOSIS — M25512 Pain in left shoulder: Secondary | ICD-10-CM

## 2021-08-15 DIAGNOSIS — M25552 Pain in left hip: Secondary | ICD-10-CM

## 2021-08-15 DIAGNOSIS — M6281 Muscle weakness (generalized): Secondary | ICD-10-CM

## 2021-08-15 DIAGNOSIS — M25551 Pain in right hip: Secondary | ICD-10-CM

## 2021-08-15 NOTE — Therapy (Signed)
OUTPATIENT PHYSICAL THERAPY TREATMENT/DISCHARGE SUMMARY   See note below for Objective Data and Assessment of Progress/Goals.     Patient Name: Nichole [name] MRN: [id number] DOB:01-21-1946, 75 y.o., female Today's Date: 08/15/2021   PT End of Session - 08/15/21 1405     Visit Number 15    Number of Visits 16    Date for PT Re-Evaluation 08/15/21    Authorization Type [employer]HC Medicare    PT Start Time 1351    PT Stop Time 1436    PT Time Calculation (min) 45 min    Activity Tolerance Patient tolerated treatment well    Behavior During Therapy [facility] for tasks assessed/performed                   History reviewed. No pertinent past medical history. History reviewed. No pertinent surgical history. There are no problems to display for this patient.   PCP: Lajean Manes, MD  REFERRING PROVIDER: Thurman Coyer, DO  REFERRING DIAG: Bilat HS tendonitis                                  L RTC tendonitis  THERAPY DIAG:  Pain in Right Hip Pain in Left shoulder Muscle Weakness Pain in Left Hip    ONSET DATE: 05/09/2021  SUBJECTIVE:                                                                                                                                                                                      SUBJECTIVE STATEMENT: -She reports compliance with HEP.  Pt denies any adverse effects after prior Rx.  Pt states she is using a prayer shawl in her seat in the car to sit on which does help.  She continues to have pain with sitting.  Her husband has been rolling her glutes/HS consistently.  Pt states her shoulder is doing great.  She was able to reach behind her back to don bra and to reach seat belt.  Pt reports no pain or difficulty with turning steering wheel and driving.  Pt ambulated 2.5 miles yesterday without increased pain.  Pt states she had some soreness in bilat hips though not pain.  Pt reports improved strength.  Pt reports she is able to perform her  normal overhead activities without significant pain or difficulty.  Pt has returned to yoga without adverse effects but is not performing certain movements that pull on the HS as directed by therapist.       PERTINENT HISTORY: Prior R hip/piriformis pain.  Osteopenia, Prior biceps tear on R and R shoulder  surgery approx 20 years.   PAIN:  Pt states she doesn't really have any pain today. 1/10 worst pain in L shoulder. 2/10 pain in bilat proximal HS/glute with sitting which increases the longer she sits.  Worst pain: 4-5/10.      PATIENT GOALS reduce popping, improved mobility without pain.  To be able turn the steering wheel.  Reaching for her bra  OBJECTIVE:   Pt is R hand dominant  UEFI:  77/80   UPPER EXTREMITY AROM/PROM:   A/PROM Right 07/18/2021 Left 07/18/2021  Shoulder flexion 166 164  Shoulder scaption 162 154  Shoulder abduction 132 135  Shoulder adduction      Shoulder extension      Shoulder internal rotation 67 74  Shoulder external rotation 75 64  Elbow flexion      Elbow extension      Wrist flexion      Wrist extension      Wrist ulnar deviation      Wrist radial deviation      Wrist pronation      Wrist supination      (Blank rows = not tested)   UPPER EXTREMITY MMT:   MMT Right 07/18/2021 Left 07/18/2021  Shoulder flexion   5/5  Shoulder scaption   5/5  Shoulder abduction   5/5   Shoulder adduction      Shoulder ER   4+/5  Shoulder IR   4+/5  Lower trapezius      Elbow flexion      Elbow extension      Wrist flexion      Wrist extension      Wrist ulnar deviation      Wrist radial deviation      Wrist pronation      Hip ER 5/5 5/5  Hip Extension 4+/5 4+/5  (Blank rows = not tested)   SHOULDER SPECIAL TESTS:           Impingement tests: Neer's Impingement Test:  negative on L.  Michel Bickers Impingement Test: positive bilat worse on L though states it's mild pain.  Speed's test: negative  on L   PALPATION:  No tenderness in palpation  L shoulder.  TTP at bilat ischial tuberosities though has reduced tenderness.    Gait: Pt ambulates with a normalized heel to toe gait without limping.        Therapeutic Exercise:  -Reviewed current function, functional improvement/deficits, pain levels, HEP compliance, and response to prior Rx. -Assessed ROM, strength, and special testing.  -Pt performed:  -lateral band walks with RTB around thighs 2 x 10-12 reps   -mini squats to table 2x10 reps           -4D ball rolls on wall at 90 deg flexion x 20 reps each  -Reviewed and updated HEP.  Spent extensive time educating pt with exercises, appropriate resistance, and appropriate frequency.  Gave pt a handout.   -See below for pt education   PATIENT EDUCATION: Education details:  Reviewed and updated HEP.  Educated pt in discharge plan.  Exercise form. Instructed pt to cont with HEP.  Pt received an updated HEP handout and was educated in correct form and appropriate frequency.   Person educated: Patient Education method: Explanation, Demonstration, verbal and tactile cues.  Handout. Education comprehension: verbalized understanding and returned demonstration, verbal and tactile cues required   HOME EXERCISE PROGRAM: Access Code: [email or web address] URL: [email or web address] Date: 08/15/2021 Prepared by: Ronny Flurry  Exercises Supine Bridge - 1  x daily - 5-6 x weekly - 2 sets - 10 reps Sidelying Hip Abduction - 1 x daily - 5 x weekly - 2 sets - 10 reps Supine Shoulder Alphabet - 1 x daily - 5 x weekly - 1 sets - 1 reps Single Arm Serratus Punches - 1 x daily - 4-5 x weekly - 2 sets - 10 reps Prone Shoulder Row - 1 x daily - 4 x weekly - 2 sets - 10 reps Clamshell with Resistance - 1 x daily - 4-5 x weekly - 2 sets - 10 reps Sidelying Shoulder External Rotation - 1 x daily - 3-4 x weekly - 2 sets - 10 reps Side Stepping with Resistance at Ankles - 1 x daily - 3 x weekly - 2-3 sets - 10 reps Standing Wall [employer]  with Mini Swiss Ball - 1 x daily - 3-4 x weekly - 1 sets - 20 reps      ASSESSMENT:  CLINICAL IMPRESSION: Pt has made excellent progress with L shoulder.  She is able to don/doff bra, drive, and perform overhead activities without difficulty or pain.  Pt has also improved with bilat glute/prox HS pain.  She continues to have pain with sitting which does depend on the surface.  Pt continues to have tenderness with palpation at bilat GT though overall improved.  Pt's husband is rolling HS and glute.  Pt is active and compliant with HEP.  She has returned to yoga without adverse effects.  Pt demonstrates improved strength t/o L shoulder and improved ER AROM.  Pt was a little weaker in R hip extension and has symmetrical strength in bilat hip extension with mild weakness.  Pt has met all goals except partially meeting LTG's #2,4  and not meeting LTG#1.          Objective impairments include decreased activity tolerance, decreased ROM, decreased strength, impaired UE functional use, and pain. These impairments are limiting patient from driving and workout activities, and dressing .      GOALS:   SHORT TERM GOALS:  STG Name Target Date Goal status  1 Pt will be independent and compliant with HEP for improved pain, strength, and function. Baseline:  06/21/2021 GOAL MET  2 Pt will report at least a 25% improvement in pain with sitting activities. Baseline:  06/21/2021 GOAL MET  3 Pt's worst pain will be no > than 6/10 for improved pain overall. Baseline: 06/21/2021 GOAL MET  4 Pt will report at least a 25% improvement in pain and sx's with reaching and daily shoulder mobility Baseline: 08/29/2021 GOAL MET  5 Pt will be able to turn steering wheel without significant shoulder pain Baseline: 09/05/2021 GOAL MET             LONG TERM GOALS:   LTG Name Target Date Goal status  1 Pt will be able to perform her normal seated activities without significant pain. Baseline: 07/13/2021 NOT MET        2 Pt will demo improved bilat hip extension strength and R hip ER strength to 5/5 MMT for improved tolerance with daily mobility and recreational/workout activities.  Baseline: 07/13/2021 PARTIALLY MET  3 Pt will be able to perform yoga without adverse effects Baseline: 07/13/2021 GOAL MET  4 Pt will demo improved L shoulder strength to 5/5 MMT except 4/5 in abd throughout for improved performance of daily activities, reaching, and lifting.  Baseline: 09/12/2021 PARTIALLY MET  5 Pt will report she is able to perform her  normal overhead activities without significant pain or difficulty.   Baseline: 09/12/2021 GOAL MET  6 Pt will be able to reach behind her back to don/doff bra without significant pain  Baseline: 09/12/2021 GOAL MET   PLAN:   PLANNED INTERVENTIONS:  Therapeutic exercises, Therapeutic activity, Neuro Muscular re-education, Patient/Family education, Joint mobilization, Aquatic Therapy, Electrical stimulation, Cryotherapy, Moist heat, Ultrasound, and Manual therapy  PLAN FOR NEXT SESSION:  Discharge pt due to meeting the majority of her goals and doing well functionally.  She is also pleased with her functional level.  Pt is agreeable with discharge.  She will cont with HEP.  Pt sees MD next week.    PHYSICAL THERAPY DISCHARGE SUMMARY  Visits from Start of Care: 15  Current functional level related to goals / functional outcomes: See above   Remaining deficits: See above   Education / Equipment: See above   Patient agrees to discharge. Patient goals were see above Patient is being discharged due to Discharge pt due to meeting the majority of her goals and doing well functionally.  She is also pleased with her functional level.     Selinda Michaels III PT, DPT 08/15/21 4:35 PM

## 2021-08-21 ENCOUNTER — Ambulatory Visit: Payer: Medicare Other | Admitting: Sports Medicine

## 2021-08-21 ENCOUNTER — Encounter: Payer: Self-pay | Admitting: Sports Medicine

## 2021-08-21 VITALS — BP 112/68 | Ht 63.5 in | Wt 142.0 lb

## 2021-08-21 DIAGNOSIS — M76899 Other specified enthesopathies of unspecified lower limb, excluding foot: Secondary | ICD-10-CM | POA: Diagnosis not present

## 2021-08-21 NOTE — Patient Instructions (Signed)
It was great seeing you today!  We ordered an MRI to better classify your pain.  Call and schedule your MRI.  After we get results we call you.   Take care,   [place]

## 2021-08-21 NOTE — Progress Notes (Addendum)
   SUBJECTIVE:   CHIEF COMPLAINT / HPI:    Nichole [name] is a 75 y.o. female here for follow of her bilaterally posterior buttock pain.  Patient described pain as "achy numbness" which is better than previous. Pain is worse with sitting (esp driving) and forward bending. Has been discharged from physical therapy.  She now bends her knees when she needs to pick up after her dogs to limit pain. She continues doing her PT exercises at home. She took the steroid dose pack but states she got COVID during the same time and is ensure if steroids helped.  Notes that her right shoulder pain is nearly resolved. Takes OTC pain medications as needed.    PERTINENT  PMH / PSH: reviewed and updated as appropriate   OBJECTIVE:   BP 112/68   Ht 5' 3.5" (1.613 m)   Wt 142 lb (64.4 kg)   BMI 24.76 kg/m    GEN: pleasant well appearing female in no acute distress  CVS: well perfused, regular rate  RESP: speaking in full sentences without pause, no respiratory distress  MSK: normal hip ROM and strength, tenderness bilaterally with palpation over ischial tuberosities  vs proximal hamstring tendons, no gross deformity     ASSESSMENT/PLAN:    Ischial Tuberosity Bursitis  vs Proximal Hamstring Tendonitis  Patient with suboptimal response to physical therapy. Reviewed PT notes and previous imaging. Discussed MR Pelvis to delineate proximal hamstring tendonitis vs ischial tuberosity bursitis. Pt agreeable to MRI. MR Pelvis ordered.  Continue OTC analgesics and home PT exercises.   Lyndee Hensen, DO PGY-3, [facility] 08/21/2021    Patient seen and evaluated with the resident.  I agree with the above plan of care.  Patient has completed physical therapy.  Left shoulder is feeling much better.  However, she is still struggling with pain at the ischial tuberosity, specifically with sitting.  Previous x-rays were unremarkable.  Given her persistent pain I would like to get further diagnostic  imaging in the form of a pelvic MRI so that I could evaluate both ischial tuberosities as well as bilateral proximal hamstring tendons.  Phone follow-up with those results when available.  We will delineate further treatment based on those findings.

## 2021-08-22 NOTE — Addendum Note (Signed)
Addended by: Lilia Argue R on: 08/22/2021 11:07 AM   Modules accepted: Level of Service

## 2021-09-06 ENCOUNTER — Other Ambulatory Visit: Payer: Self-pay

## 2021-09-06 ENCOUNTER — Ambulatory Visit
Admission: RE | Admit: 2021-09-06 | Discharge: 2021-09-06 | Disposition: A | Discharge status: Home / Self Care | Payer: Medicare Other | Source: Ambulatory Visit | Attending: Sports Medicine | Admitting: Sports Medicine

## 2021-09-06 DIAGNOSIS — M76899 Other specified enthesopathies of unspecified lower limb, excluding foot: Secondary | ICD-10-CM

## 2021-09-18 ENCOUNTER — Telehealth: Payer: Self-pay | Admitting: Sports Medicine

## 2021-09-18 NOTE — Telephone Encounter (Signed)
I spoke with Nichole [name] on the phone today after reviewing the MRI of her pelvis.  She has severe tendinosis of both the left and right hamstrings.  High-grade partial tearing of the left hamstring as well.  In addition, she has severe degenerative changes of the right hip.  She continues to have pain in the posterior hip primarily with sitting.  It is persistent despite treatment to date.  I recommended consultation with Dr. Ninfa Linden to see what he would recommend.  Nichole [name] is in agreement with that plan.  I will defer further work-up and treatment to the discretion of Dr. Ninfa Linden and the patient will follow up with me as needed.

## 2021-09-19 ENCOUNTER — Other Ambulatory Visit: Payer: Self-pay

## 2021-09-19 DIAGNOSIS — M76899 Other specified enthesopathies of unspecified lower limb, excluding foot: Secondary | ICD-10-CM

## 2021-09-24 ENCOUNTER — Ambulatory Visit: Payer: Medicare Other | Admitting: Orthopaedic Surgery

## 2021-09-24 ENCOUNTER — Encounter: Payer: Self-pay | Admitting: Orthopaedic Surgery

## 2021-09-24 DIAGNOSIS — M76899 Other specified enthesopathies of unspecified lower limb, excluding foot: Secondary | ICD-10-CM

## 2021-09-24 DIAGNOSIS — M25551 Pain in right hip: Secondary | ICD-10-CM | POA: Diagnosis not present

## 2021-09-24 DIAGNOSIS — M1611 Unilateral primary osteoarthritis, right hip: Secondary | ICD-10-CM

## 2021-09-24 NOTE — Progress Notes (Signed)
Office Visit Note   Patient: Nichole [name]           Date of Birth: Jan 04, 1946           MRN: [id number] Visit Date: 09/24/2021              Requested by: Thurman Coyer, DO [address],  [place] [address] PCP: Lajean Manes, MD   Assessment & Plan: Visit Diagnoses:  1. Pain in right hip   2. Unilateral primary osteoarthritis, right hip   3. Hamstring tendinitis     Plan: We talked in length in detail about her hamstrings and there is not really anything she should do other than avoid squats and lunges and exercise walk but avoid hills.  As of now, I would not recommend anything for her right hip in terms of the replacement given her pain is minimal and she does not feel like she needs that as well and I agree that we should treat her symptomatically.  If she does develop worsening right hip pain and groin pain she knows to let us know.  All questions and concerns were answered and addressed.  Follow-up is as needed.  Follow-Up Instructions: Return if symptoms worsen or fail to improve.   Orders:  No orders of the defined types were placed in this encounter.  No orders of the defined types were placed in this encounter.     Procedures: No procedures performed   Clinical Data: No additional findings.   Subjective: No chief complaint on file. The patient is a 76 year old female sent from Dr. Micheline Chapman to evaluate right hip arthritis that was found on a MRI but also chronic tendinosis of the hamstring origin of both her hamstrings.  Is really her right hip that bothers her the most but not in the groin at all.  She is incredibly active and has had 3 months of physical therapy.  She has had prednisone as well and is not really getting much better however she does not feel like she is at the point that she needs any type of hip replacement.  She still denies any groin pain.  She is very active and does a lot of yoga and other exercising.  She points to the ischium  of both areas of the source that she hurts with some of the right hip posterior trochanteric area.  HPI  Review of Systems She currently denies a headache, chest pain, shortness of breath, fever, chills, nausea, vomiting  Objective: Vital Signs: There were no vitals taken for this visit.  Physical Exam She is alert and orient x3 and in no acute distress.  She does not walk with a limp or an assistive device. Ortho Exam Both hips actually move smoothly and fluidly with no pain in the groin at all.  She does have pain slightly over the posterior aspect of the trochanteric area on the right side and some over the hamstrings on both sides. Specialty Comments:  No specialty comments available.  Imaging: No results found. I did independently review the plain films of her pelvis as well as the MRI and showed her the imaging.  She does have significant edema in the femoral head neck on the right hip suggesting significant arthritis.  The plain films do not correlate with that but her MRI is quite impressive in terms of the arthritis.  PMFS History: There are no problems to display for this patient.  History reviewed. No pertinent past medical  history.  History reviewed. No pertinent family history.  History reviewed. No pertinent surgical history. Social History   Occupational History   Not on file  Tobacco Use   Smoking status: Not on file   Smokeless tobacco: Not on file  Substance and Sexual Activity   Alcohol use: Not on file   Drug use: Not on file   Sexual activity: Not on file

## 2021-11-05 ENCOUNTER — Other Ambulatory Visit (HOSPITAL_BASED_OUTPATIENT_CLINIC_OR_DEPARTMENT_OTHER): Payer: Self-pay

## 2021-11-05 MED ORDER — PEG 3350-KCL-NA BICARB-NACL 420 G PO SOLR
ORAL | 0 refills | Status: DC
  Filled 2021-11-05: qty 4000, 1d supply, fill #0

## 2021-12-24 ENCOUNTER — Ambulatory Visit (INDEPENDENT_AMBULATORY_CARE_PROVIDER_SITE_OTHER): Payer: Medicare Other | Admitting: Orthopaedic Surgery

## 2021-12-24 DIAGNOSIS — M1611 Unilateral primary osteoarthritis, right hip: Secondary | ICD-10-CM | POA: Insufficient documentation

## 2021-12-24 NOTE — Progress Notes (Signed)
The patient is here today again to discuss hip replacement surgery.  She is very active 76 year old female who stays at [facility] with her husband.  She has known severe osteoarthritis of the right hip that has been seen on plain film but more so on MRI.  The plain film was not impressive the MRI shows extensive edema in the femoral head and neck as well as cystic changes and joint space narrowing as well as cartilage loss.  This has caused a chronic constant pain on a daily basis.  She is scheduled again for hip replacement already and may which is next month.  She had a lot of legitimate questions today about the surgery.  We talked about spinal anesthesia and about what to expect from the risk and benefit standpoint.  We talked about what to expect from the intraoperative and postoperative course.  Her right hip on exam does have pain with internal and external rotation and pain is in the groin.  There is stiffness with that hip as well.  Her left hip exam is completely normal. ? ?We did review all of her records in epic and talked about her health status and her medicines.  She is a very active and healthy individual overall and is ready to proceed with surgery.  All questions and concerns were answered and addressed. ?

## 2021-12-26 ENCOUNTER — Other Ambulatory Visit: Payer: Self-pay

## 2022-01-08 NOTE — Patient Instructions (Signed)
DUE TO COVID-19 ONLY TWO VISITORS  (aged 77 and older)  ARE ALLOWED TO COME WITH YOU AND STAY IN THE WAITING ROOM ONLY DURING PRE OP AND PROCEDURE.   ?**NO VISITORS ARE ALLOWED IN THE SHORT STAY AREA OR RECOVERY ROOM!!** ? ?IF YOU WILL BE ADMITTED INTO THE HOSPITAL YOU ARE ALLOWED ONLY FOUR SUPPORT PEOPLE DURING VISITATION HOURS ONLY (7 AM -8PM)   ?The support person(s) must pass our screening, gel in and out, and wear a mask at all times, including in the patient?s room. ?Patients must also wear a mask when staff or their support person are in the room. ?Visitors GUEST BADGE MUST BE WORN VISIBLY  ?One adult visitor may remain with you overnight and MUST be in the room by 8 P.M. ?  ? ? Your procedure is scheduled on: 01/18/22 ? ? Report to [facility] Main Entrance ? ?  Report to admitting at  7:00 AM ? ? Call this number if you have problems the morning of surgery [phone] ? ? Do not eat food or frink:After Midnight. ? ?  ? ?  ? ?  ?       If you have questions, please contact your surgeon?s office. ? ? ?  ?  ?Oral Hygiene is also important to reduce your risk of infection.                                    ?Remember - BRUSH YOUR TEETH THE MORNING OF SURGERY WITH YOUR REGULAR TOOTHPASTE ? ? Do NOT smoke after Midnight ? ? Take these medicines the morning of surgery with A SIP OF WATER: Paxil ? ?DO NOT TAKE ANY ORAL DIABETIC MEDICATIONS DAY OF YOUR SURGERY ? ?Bring CPAP mask and tubing day of surgery. ?                  ?           You may not have any metal on your body including hair pins, jewelry, and body piercing ? ?           Do not wear make-up, lotions, powders, perfumes/cologne, or deodorant ? ?Do not wear nail polish including gel and S&S, artificial/acrylic nails, or any other type of covering on natural nails including finger and toenails. If you have artificial nails, gel coating, etc. that needs to be removed by a nail salon please have this removed prior to surgery or surgery may need to  be canceled/ delayed if the surgeon/ anesthesia feels like they are unable to be safely monitored.  ? ?Do not shave  48 hours prior to surgery.  ? ?          ? ? Do not bring valuables to the hospital. [facility] NOT ?            RESPONSIBLE   FOR VALUABLES. ? ? Contacts, dentures or bridgework may not be worn into surgery. ? ? Bring small overnight bag day of surgery. ?  ? ? Special Instructions: Bring a copy of your healthcare power of attorney and living will documents the day of surgery if you haven't scanned them before. ? ?            Please read over the following fact sheets you were given: IF [place] [phone] ? ?   [facility] - Preparing  for Surgery ?Before surgery, you can play an important role.  Because skin is not sterile, your skin needs to be as free of germs as possible.  You can reduce the number of germs on your skin by washing with CHG (chlorahexidine gluconate) soap before surgery.  CHG is an antiseptic cleaner which kills germs and bonds with the skin to continue killing germs even after washing. ?Please DO NOT use if you have an allergy to CHG or antibacterial soaps.  If your skin becomes reddened/irritated stop using the CHG and inform your nurse when you arrive at Short Stay. ?Do not shave (including legs and underarms) for at least 48 hours prior to the first CHG shower.  ?Please follow these instructions carefully: ? 1.  Shower with CHG Soap the night before surgery and the  morning of Surgery. ? 2.  If you choose to wash your hair, wash your hair first as usual with your  normal  shampoo. ? 3.  After you shampoo, rinse your hair and body thoroughly to remove the  shampoo.                     ?       4.  Use CHG as you would any other liquid soap.  You can apply chg directly  to the skin and wash  ?                     Gently with a scrungie or clean washcloth. ? 5.  Apply the CHG Soap to your body ONLY FROM THE NECK DOWN.   Do  not use on face/ open      ?                     Wound or open sores. Avoid contact with eyes, ears mouth and genitals (private parts).  ?                     Production manager,  Genitals (private parts) with your normal soap. ?            6.  Wash thoroughly, paying special attention to the area where your surgery  will be performed. ? 7.  Thoroughly rinse your body with warm water from the neck down. ? 8.  DO NOT shower/wash with your normal soap after using and rinsing off  the CHG Soap. ?               9.  Pat yourself dry with a clean towel. ?           10.  Wear clean pajamas. ?           11.  Place clean sheets on your bed the night of your first shower and do not  sleep with pets. ?Day of Surgery : ?Do not apply any lotions/deodorants the morning of surgery.  Please wear clean clothes to the hospital/surgery center. ? ?FAILURE TO FOLLOW THESE INSTRUCTIONS MAY RESULT IN THE CANCELLATION OF YOUR SURGERY ? ? ?________________________________________________________________________  ? ?Incentive Spirometer ? ?An incentive spirometer is a tool that can help keep your lungs clear and active. This tool measures how well you are filling your lungs with each breath. Taking long deep breaths may help reverse or decrease the chance of developing breathing (pulmonary) problems (especially infection) following: ?A long period of time when you are unable to move or be active. ?BEFORE THE PROCEDURE  ?  If the spirometer includes an indicator to show your best effort, your nurse or respiratory therapist will set it to a desired goal. ?If possible, sit up straight or lean slightly forward. Try not to slouch. ?Hold the incentive spirometer in an upright position. ?INSTRUCTIONS FOR USE  ?Sit on the edge of your bed if possible, or sit up as far as you can in bed or on a chair. ?Hold the incentive spirometer in an upright position. ?Breathe out normally. ?Place the mouthpiece in your mouth and seal your lips tightly around it. ?Breathe  in slowly and as deeply as possible, raising the piston or the ball toward the top of the column. ?Hold your breath for 3-5 seconds or for as long as possible. Allow the piston or ball to fall to the bottom of the column. ?Remove the mouthpiece from your mouth and breathe out normally. ?Rest for a few seconds and repeat Steps 1 through 7 at least 10 times every 1-2 hours when you are awake. Take your time and take a few normal breaths between deep breaths. ?The spirometer may include an indicator to show your best effort. Use the indicator as a goal to work toward during each repetition. ?After each set of 10 deep breaths, practice coughing to be sure your lungs are clear. If you have an incision (the cut made at the time of surgery), support your incision when coughing by placing a pillow or rolled up towels firmly against it. ?Once you are able to get out of bed, walk around indoors and cough well. You may stop using the incentive spirometer when instructed by your caregiver.  ?RISKS AND COMPLICATIONS ?Take your time so you do not get dizzy or light-headed. ?If you are in pain, you may need to take or ask for pain medication before doing incentive spirometry. It is harder to take a deep breath if you are having pain. ?AFTER USE ?Rest and breathe slowly and easily. ?It can be helpful to keep track of a log of your progress. Your caregiver can provide you with a simple table to help with this. ?If you are using the spirometer at home, follow these instructions: ?SEEK MEDICAL CARE IF:  ?You are having difficultly using the spirometer. ?You have trouble using the spirometer as often as instructed. ?Your pain medication is not giving enough relief while using the spirometer. ?You develop fever of 100.5? F (38.1? C) or higher. ?SEEK IMMEDIATE MEDICAL CARE IF:  ?You cough up bloody sputum that had not been present before. ?You develop fever of 102? F (38.9? C) or greater. ?You develop worsening pain at or near the  incision site. ?MAKE SURE YOU:  ?Understand these instructions. ?Will watch your condition. ?Will get help right away if you are not doing well or get worse. ?Document Released: 01/13/2007 Document Revised: 0

## 2022-01-09 ENCOUNTER — Encounter (HOSPITAL_COMMUNITY): Payer: Self-pay | Admitting: *Deleted

## 2022-01-09 ENCOUNTER — Other Ambulatory Visit: Payer: Self-pay

## 2022-01-09 ENCOUNTER — Encounter (HOSPITAL_COMMUNITY)
Admission: RE | Admit: 2022-01-09 | Discharge: 2022-01-09 | Disposition: A | Discharge status: Home / Self Care | Payer: Medicare Other | Source: Ambulatory Visit | Attending: Orthopaedic Surgery | Admitting: Orthopaedic Surgery

## 2022-01-09 VITALS — BP 135/64 | HR 57 | Temp 97.5°F | Resp 18 | Ht 64.5 in | Wt 143.0 lb

## 2022-01-09 DIAGNOSIS — Z01818 Encounter for other preprocedural examination: Secondary | ICD-10-CM | POA: Insufficient documentation

## 2022-01-09 HISTORY — DX: Prediabetes: R73.03

## 2022-01-09 HISTORY — DX: Lipoprotein deficiency: E78.6

## 2022-01-09 HISTORY — DX: Unspecified osteoarthritis, unspecified site: M19.90

## 2022-01-09 HISTORY — DX: Anxiety disorder, unspecified: F41.9

## 2022-01-09 LAB — GLUCOSE, CAPILLARY: Glucose-Capillary: 97 mg/dL (ref 70–99)

## 2022-01-09 LAB — BASIC METABOLIC PANEL
Anion gap: 7 (ref 5–15)
BUN: 18 mg/dL (ref 8–23)
CO2: 27 mmol/L (ref 22–32)
Calcium: 9.5 mg/dL (ref 8.9–10.3)
Chloride: 107 mmol/L (ref 98–111)
Creatinine, Ser: 0.84 mg/dL (ref 0.44–1.00)
GFR, Estimated: >60 mL/min (ref >60)
Glucose, Bld: 107 mg/dL — ABNORMAL HIGH (ref 70–99)
Potassium: 4.7 mmol/L (ref 3.5–5.1)
Sodium: 141 mmol/L (ref 135–145)

## 2022-01-09 LAB — CBC
HCT: 41 % (ref 36.0–46.0)
Hemoglobin: 13.1 g/dL (ref 12.0–15.0)
MCH: 29.4 pg (ref 26.0–34.0)
MCHC: 32 g/dL (ref 30.0–36.0)
MCV: 91.9 fL (ref 80.0–100.0)
Platelets: 294 10*3/uL (ref 150–400)
RBC: 4.46 MIL/uL (ref 3.87–5.11)
RDW: 13.2 % (ref 11.5–15.5)
WBC: 6 10*3/uL (ref 4.0–10.5)
nRBC: 0 % (ref 0.0–0.2)

## 2022-01-09 LAB — HEMOGLOBIN A1C
Hgb A1c MFr Bld: 6.3 % — ABNORMAL HIGH (ref 4.8–5.6)
Mean Plasma Glucose: 134.11 mg/dL

## 2022-01-09 LAB — SURGICAL PCR SCREEN
MRSA, PCR: NEGATIVE
Staphylococcus aureus: NEGATIVE

## 2022-01-09 NOTE — Progress Notes (Signed)
Anesthesia note: ? ?Bowel prep reminder:NA ? ?PCP - Dr. Particia Nearing ?Cardiologist -none ?Other-  ? ?Chest x-ray - no ?EKG - 01/09/22-chart ?Stress Test - no ?ECHO - no ?Cardiac Cath - no ? ?Pacemaker/ICD device last checked:NA ? ?Sleep Study - no ?CPAP -  ? ?Pt is pre diabetic-Diet controlled no meds ?Fasting Blood Sugar -  ?Checks Blood Sugar _____ ? ?Blood Thinner:NA ?Blood Thinner Instructions: ?Aspirin Instructions: ?Last Dose: ? ?Anesthesia review: no ? ?Patient denies shortness of breath, fever, cough and chest pain at PAT appointment ?Pt has no SOB with activities.  ? ?Patient verbalized understanding of instructions that were given to them at the PAT appointment. Patient was also instructed that they will need to review over the PAT instructions again at home before surgery. Yes. Husband was with her ?

## 2022-01-10 ENCOUNTER — Other Ambulatory Visit: Payer: Self-pay | Admitting: Physician Assistant

## 2022-01-10 DIAGNOSIS — M1611 Unilateral primary osteoarthritis, right hip: Secondary | ICD-10-CM

## 2022-01-10 NOTE — Progress Notes (Signed)
surgery

## 2022-01-17 NOTE — H&P (Signed)
TOTAL HIP ADMISSION H&P ? ?Patient is admitted for right total hip arthroplasty. ? ?Subjective: ? ?Chief Complaint: right hip pain ? ?HPI: Nichole [name], 76 y.o. female, has a history of pain and functional disability in the right hip(s) due to arthritis and patient has failed non-surgical conservative treatments for greater than 12 weeks to include NSAID's and/or analgesics, corticosteriod injections, flexibility and strengthening excercises, use of assistive devices, weight reduction as appropriate, and activity modification.  Onset of symptoms was gradual starting 1 years ago with gradually worsening course since that time.The patient noted no past surgery on the right hip(s).  Patient currently rates pain in the right hip at 10 out of 10 with activity. Patient has night pain, worsening of pain with activity and weight bearing, pain that interfers with activities of daily living, and pain with passive range of motion. Patient has evidence of subchondral sclerosis, periarticular osteophytes, and joint space narrowing by imaging studies. This condition presents safety issues increasing the risk of falls.  There is no current active infection. ? ?Patient Active Problem List  ? Diagnosis Date Noted  ? Unilateral primary osteoarthritis, right hip 12/24/2021  ? ?Past Medical History:  ?Diagnosis Date  ? Anxiety   ? Arthritis   ? HDL deficiency   ? Pre-diabetes   ? Diet controlled  ?  ?Past Surgical History:  ?Procedure Laterality Date  ? COLONOSCOPY    ? EYE SURGERY Bilateral 2018  ? cateract  ? SHOULDER ARTHROSCOPY Right 1997  ? TONSILLECTOMY    ? age 60  ?  ?No current facility-administered medications for this encounter.  ? ?Current Outpatient Medications  ?Medication Sig Dispense Refill Last Dose  ? alendronate (FOSAMAX) 70 MG tablet 1 tablet 30 minutes before the first food, beverage or medicine of the day with plain water Orally Once a week 30 day(s) 4 tablet 11   ? CALCIUM PO Take 1,000 mg by mouth at bedtime.      ? Cholecalciferol (VITAMIN D) 50 MCG (2000 UT) tablet Take 2,000 Units by mouth daily.     ? ezetimibe (ZETIA) 10 MG tablet Take 10 mg by mouth every evening.     ? Ibuprofen-Acetaminophen (ADVIL DUAL ACTION) 125-250 MG TABS Take 2 tablets by mouth daily as needed (pain).     ? PARoxetine (PAXIL-CR) 25 MG 24 hr tablet Take 25 mg by mouth daily.     ? Polyethyl Glycol-Propyl Glycol (SYSTANE OP) Place 1 drop into both eyes 2 (two) times daily.     ? simvastatin (ZOCOR) 40 MG tablet Take 40 mg by mouth every evening.     ? sodium chloride (OCEAN) 0.65 % SOLN nasal spray Place 1 spray into both nostrils as needed for congestion.     ? ?No Known Allergies  ?Social History  ? ?Tobacco Use  ? Smoking status: Never  ? Smokeless tobacco: Never  ?Substance Use Topics  ? Alcohol use: Yes  ?  Alcohol/week: 14.0 standard drinks  ?  Types: 14 Glasses of wine per week  ?  ?No family history on file.  ? ?Review of Systems  ?All other systems reviewed and are negative. ? ?Objective: ? ?Physical Exam ?Vitals reviewed.  ?Constitutional:   ?   Appearance: Normal appearance.  ?HENT:  ?   Head: Normocephalic and atraumatic.  ?Eyes:  ?   Extraocular Movements: Extraocular movements intact.  ?   Pupils: Pupils are equal, round, and reactive to light.  ?Cardiovascular:  ?   Rate and Rhythm:  Normal rate and regular rhythm.  ?   Pulses: Normal pulses.  ?Pulmonary:  ?   Effort: Pulmonary effort is normal.  ?   Breath sounds: Normal breath sounds.  ?Abdominal:  ?   Palpations: Abdomen is soft.  ?Musculoskeletal:  ?   Cervical back: Normal range of motion and neck supple.  ?   Right hip: Tenderness and bony tenderness present. Decreased range of motion. Decreased strength.  ?Neurological:  ?   Mental Status: She is alert and oriented to person, place, and time.  ?Psychiatric:     ?   Behavior: Behavior normal.  ? ? ?Vital signs in last 24 hours: ?  ? ?Labs: ? ? ?Estimated body mass index is 24.17 kg/m? as calculated from the following: ?   Height as of 01/09/22: 5' 4.5" (1.638 m). ?  Weight as of 01/09/22: 64.9 kg. ? ? ?Imaging Review ?MRI demonstrates severe degenerative joint disease of the right hip(s). The bone quality appears to be good for age and reported activity level. ? ? ? ? ? ?Assessment/Plan: ? ?End stage arthritis, right hip(s) ? ?The patient history, physical examination, clinical judgement of the provider and imaging studies are consistent with end stage degenerative joint disease of the right hip(s) and total hip arthroplasty is deemed medically necessary. The treatment options including medical management, injection therapy, arthroscopy and arthroplasty were discussed at length. The risks and benefits of total hip arthroplasty were presented and reviewed. The risks due to aseptic loosening, infection, stiffness, dislocation/subluxation,  thromboembolic complications and other imponderables were discussed.  The patient acknowledged the explanation, agreed to proceed with the plan and consent was signed. Patient is being admitted for inpatient treatment for surgery, pain control, PT, OT, prophylactic antibiotics, VTE prophylaxis, progressive ambulation and ADL's and discharge planning.The patient is planning to be discharged home with home health services ? ? ? ?

## 2022-01-18 ENCOUNTER — Other Ambulatory Visit: Payer: Self-pay

## 2022-01-18 ENCOUNTER — Observation Stay (HOSPITAL_COMMUNITY): Payer: Medicare Other

## 2022-01-18 ENCOUNTER — Observation Stay (HOSPITAL_COMMUNITY)
Admission: RE | Admit: 2022-01-18 | Discharge: 2022-01-19 | Disposition: A | Discharge status: Skilled Nursing Facility | Payer: Medicare Other | Source: Ambulatory Visit | Attending: Orthopaedic Surgery | Admitting: Orthopaedic Surgery

## 2022-01-18 ENCOUNTER — Ambulatory Visit (HOSPITAL_COMMUNITY): Payer: Medicare Other

## 2022-01-18 ENCOUNTER — Encounter (HOSPITAL_COMMUNITY)
Admission: RE | Disposition: A | Discharge status: Skilled Nursing Facility | Payer: Self-pay | Source: Ambulatory Visit | Attending: Orthopaedic Surgery

## 2022-01-18 ENCOUNTER — Ambulatory Visit (HOSPITAL_COMMUNITY): Payer: Medicare Other | Admitting: Certified Registered Nurse Anesthetist

## 2022-01-18 ENCOUNTER — Ambulatory Visit (HOSPITAL_BASED_OUTPATIENT_CLINIC_OR_DEPARTMENT_OTHER): Payer: Medicare Other | Admitting: Certified Registered Nurse Anesthetist

## 2022-01-18 ENCOUNTER — Encounter (HOSPITAL_COMMUNITY): Payer: Self-pay | Admitting: Orthopaedic Surgery

## 2022-01-18 DIAGNOSIS — M25751 Osteophyte, right hip: Secondary | ICD-10-CM | POA: Diagnosis not present

## 2022-01-18 DIAGNOSIS — R262 Difficulty in walking, not elsewhere classified: Secondary | ICD-10-CM | POA: Diagnosis not present

## 2022-01-18 DIAGNOSIS — Z01818 Encounter for other preprocedural examination: Secondary | ICD-10-CM

## 2022-01-18 DIAGNOSIS — Z96641 Presence of right artificial hip joint: Secondary | ICD-10-CM

## 2022-01-18 DIAGNOSIS — M25551 Pain in right hip: Secondary | ICD-10-CM | POA: Diagnosis not present

## 2022-01-18 DIAGNOSIS — M1611 Unilateral primary osteoarthritis, right hip: Secondary | ICD-10-CM

## 2022-01-18 DIAGNOSIS — F419 Anxiety disorder, unspecified: Secondary | ICD-10-CM | POA: Insufficient documentation

## 2022-01-18 DIAGNOSIS — Z79899 Other long term (current) drug therapy: Secondary | ICD-10-CM | POA: Insufficient documentation

## 2022-01-18 DIAGNOSIS — Z791 Long term (current) use of non-steroidal anti-inflammatories (NSAID): Secondary | ICD-10-CM | POA: Diagnosis not present

## 2022-01-18 DIAGNOSIS — R609 Edema, unspecified: Secondary | ICD-10-CM | POA: Insufficient documentation

## 2022-01-18 HISTORY — PX: TOTAL HIP ARTHROPLASTY: SHX124

## 2022-01-18 LAB — ABO/RH: ABO/RH(D): O POS

## 2022-01-18 SURGERY — ARTHROPLASTY, HIP, TOTAL, ANTERIOR APPROACH
Anesthesia: Spinal | Site: Hip | Laterality: Right

## 2022-01-18 MED ORDER — OXYCODONE HCL 5 MG PO TABS
5.0000 mg | ORAL_TABLET | Freq: Once | ORAL | Status: AC | PRN
  Administered 2022-01-18: 5 mg via ORAL

## 2022-01-18 MED ORDER — METHOCARBAMOL 500 MG IVPB - SIMPLE MED
500.0000 mg | Freq: Four times a day (QID) | INTRAVENOUS | Status: DC | PRN
  Administered 2022-01-18: 500 mg via INTRAVENOUS
  Filled 2022-01-18: qty 50

## 2022-01-18 MED ORDER — DIPHENHYDRAMINE HCL 12.5 MG/5ML PO ELIX: 12.5000 mg | ORAL_SOLUTION | ORAL | Status: DC | PRN

## 2022-01-18 MED ORDER — EPHEDRINE SULFATE-NACL 50-0.9 MG/10ML-% IV SOSY
PREFILLED_SYRINGE | INTRAVENOUS | Status: DC | PRN
Start: 2022-01-18 — End: 2022-01-18
  Administered 2022-01-18: 5 mg via INTRAVENOUS

## 2022-01-18 MED ORDER — DOCUSATE SODIUM 100 MG PO CAPS
100.0000 mg | ORAL_CAPSULE | Freq: Two times a day (BID) | ORAL | Status: DC
  Administered 2022-01-18 – 2022-01-19 (×2): 100 mg via ORAL
  Filled 2022-01-18 (×2): qty 1

## 2022-01-18 MED ORDER — OXYCODONE HCL 5 MG/5ML PO SOLN: 5.0000 mg | Freq: Once | ORAL | Status: AC | PRN

## 2022-01-18 MED ORDER — ALUM & MAG HYDROXIDE-SIMETH 200-200-20 MG/5ML PO SUSP: 30.0000 mL | ORAL | Status: DC | PRN

## 2022-01-18 MED ORDER — METHOCARBAMOL 500 MG IVPB - SIMPLE MED
INTRAVENOUS | Status: AC
  Filled 2022-01-18: qty 50

## 2022-01-18 MED ORDER — PHENYLEPHRINE HCL-NACL 20-0.9 MG/250ML-% IV SOLN
INTRAVENOUS | Status: DC | PRN
  Administered 2022-01-18: 40 ug/min via INTRAVENOUS

## 2022-01-18 MED ORDER — STERILE WATER FOR IRRIGATION IR SOLN
Status: DC | PRN
Start: 2022-01-18 — End: 2022-01-18
  Administered 2022-01-18: 2000 mL

## 2022-01-18 MED ORDER — ONDANSETRON HCL 4 MG/2ML IJ SOLN
INTRAMUSCULAR | Status: DC | PRN
  Administered 2022-01-18: 4 mg via INTRAVENOUS

## 2022-01-18 MED ORDER — ACETAMINOPHEN 500 MG PO TABS
1000.0000 mg | ORAL_TABLET | Freq: Once | ORAL | Status: AC
  Administered 2022-01-18: 1000 mg via ORAL
  Filled 2022-01-18: qty 2

## 2022-01-18 MED ORDER — TRANEXAMIC ACID-NACL 1000-0.7 MG/100ML-% IV SOLN
1000.0000 mg | INTRAVENOUS | Status: AC
  Administered 2022-01-18: 1000 mg via INTRAVENOUS
  Filled 2022-01-18: qty 100

## 2022-01-18 MED ORDER — PROPOFOL 500 MG/50ML IV EMUL
INTRAVENOUS | Status: DC | PRN
  Administered 2022-01-18: 100 ug/kg/min via INTRAVENOUS

## 2022-01-18 MED ORDER — PHENOL 1.4 % MT LIQD: 1.0000 | OROMUCOSAL | Status: DC | PRN

## 2022-01-18 MED ORDER — HYDROMORPHONE HCL 1 MG/ML IJ SOLN: 0.5000 mg | INTRAMUSCULAR | Status: DC | PRN

## 2022-01-18 MED ORDER — SODIUM CHLORIDE 0.9 % IV SOLN: INTRAVENOUS | Status: DC

## 2022-01-18 MED ORDER — METOCLOPRAMIDE HCL 5 MG PO TABS: 5.0000 mg | ORAL_TABLET | Freq: Three times a day (TID) | ORAL | Status: DC | PRN

## 2022-01-18 MED ORDER — 0.9 % SODIUM CHLORIDE (POUR BTL) OPTIME
TOPICAL | Status: DC | PRN
  Administered 2022-01-18: 1000 mL

## 2022-01-18 MED ORDER — CHLORHEXIDINE GLUCONATE 0.12 % MT SOLN
15.0000 mL | Freq: Once | OROMUCOSAL | Status: AC
  Administered 2022-01-18: 15 mL via OROMUCOSAL

## 2022-01-18 MED ORDER — MENTHOL 3 MG MT LOZG: 1.0000 | LOZENGE | OROMUCOSAL | Status: DC | PRN

## 2022-01-18 MED ORDER — ACETAMINOPHEN 325 MG PO TABS
325.0000 mg | ORAL_TABLET | Freq: Four times a day (QID) | ORAL | Status: DC | PRN
  Administered 2022-01-19: 650 mg via ORAL
  Filled 2022-01-18 (×2): qty 2

## 2022-01-18 MED ORDER — POVIDONE-IODINE 10 % EX SWAB
2.0000 "application " | Freq: Once | CUTANEOUS | Status: AC
  Administered 2022-01-18: 2 via TOPICAL

## 2022-01-18 MED ORDER — ONDANSETRON HCL 4 MG/2ML IJ SOLN
INTRAMUSCULAR | Status: AC
  Filled 2022-01-18: qty 2

## 2022-01-18 MED ORDER — CEFAZOLIN SODIUM-DEXTROSE 2-4 GM/100ML-% IV SOLN
2.0000 g | INTRAVENOUS | Status: AC
  Administered 2022-01-18: 2 g via INTRAVENOUS
  Filled 2022-01-18: qty 100

## 2022-01-18 MED ORDER — OXYCODONE HCL 5 MG PO TABS
5.0000 mg | ORAL_TABLET | ORAL | Status: DC | PRN
  Administered 2022-01-18: 10 mg via ORAL
  Filled 2022-01-18: qty 2

## 2022-01-18 MED ORDER — EZETIMIBE 10 MG PO TABS
10.0000 mg | ORAL_TABLET | Freq: Every evening | ORAL | Status: DC
  Administered 2022-01-18: 10 mg via ORAL
  Filled 2022-01-18: qty 1

## 2022-01-18 MED ORDER — POLYETHYLENE GLYCOL 3350 17 G PO PACK: 17.0000 g | PACK | Freq: Every day | ORAL | Status: DC | PRN

## 2022-01-18 MED ORDER — OXYCODONE HCL 5 MG PO TABS
10.0000 mg | ORAL_TABLET | ORAL | Status: DC | PRN
  Administered 2022-01-18 – 2022-01-19 (×4): 15 mg via ORAL
  Filled 2022-01-18 (×2): qty 3
  Filled 2022-01-18: qty 2
  Filled 2022-01-18 (×2): qty 3

## 2022-01-18 MED ORDER — FENTANYL CITRATE (PF) 100 MCG/2ML IJ SOLN
INTRAMUSCULAR | Status: AC
  Filled 2022-01-18: qty 2

## 2022-01-18 MED ORDER — HYDROMORPHONE HCL 1 MG/ML IJ SOLN: 0.2500 mg | INTRAMUSCULAR | Status: DC | PRN

## 2022-01-18 MED ORDER — VITAMIN D 25 MCG (1000 UNIT) PO TABS
2000.0000 [IU] | ORAL_TABLET | Freq: Every day | ORAL | Status: DC
  Administered 2022-01-19: 2000 [IU] via ORAL
  Filled 2022-01-18: qty 2

## 2022-01-18 MED ORDER — ONDANSETRON HCL 4 MG PO TABS
4.0000 mg | ORAL_TABLET | Freq: Four times a day (QID) | ORAL | Status: DC | PRN
  Administered 2022-01-18 – 2022-01-19 (×2): 4 mg via ORAL
  Filled 2022-01-18 (×2): qty 1

## 2022-01-18 MED ORDER — ORAL CARE MOUTH RINSE: 15.0000 mL | Freq: Once | OROMUCOSAL | Status: AC

## 2022-01-18 MED ORDER — PAROXETINE HCL ER 25 MG PO TB24
25.0000 mg | ORAL_TABLET | Freq: Every day | ORAL | Status: DC
  Administered 2022-01-19: 25 mg via ORAL
  Filled 2022-01-18: qty 1

## 2022-01-18 MED ORDER — ONDANSETRON HCL 4 MG/2ML IJ SOLN: 4.0000 mg | Freq: Four times a day (QID) | INTRAMUSCULAR | Status: DC | PRN

## 2022-01-18 MED ORDER — PROPOFOL 10 MG/ML IV BOLUS
INTRAVENOUS | Status: DC | PRN
  Administered 2022-01-18: 30 mg via INTRAVENOUS

## 2022-01-18 MED ORDER — METHOCARBAMOL 500 MG PO TABS
500.0000 mg | ORAL_TABLET | Freq: Four times a day (QID) | ORAL | Status: DC | PRN
  Administered 2022-01-18: 500 mg via ORAL
  Filled 2022-01-18 (×2): qty 1

## 2022-01-18 MED ORDER — PROPOFOL 1000 MG/100ML IV EMUL
INTRAVENOUS | Status: AC
  Filled 2022-01-18: qty 100

## 2022-01-18 MED ORDER — METOCLOPRAMIDE HCL 5 MG/ML IJ SOLN: 5.0000 mg | Freq: Three times a day (TID) | INTRAMUSCULAR | Status: DC | PRN

## 2022-01-18 MED ORDER — ASPIRIN 81 MG PO CHEW
81.0000 mg | CHEWABLE_TABLET | Freq: Two times a day (BID) | ORAL | Status: DC
  Administered 2022-01-18 – 2022-01-19 (×2): 81 mg via ORAL
  Filled 2022-01-18 (×2): qty 1

## 2022-01-18 MED ORDER — FENTANYL CITRATE (PF) 100 MCG/2ML IJ SOLN
INTRAMUSCULAR | Status: DC | PRN
  Administered 2022-01-18 (×2): 50 ug via INTRAVENOUS

## 2022-01-18 MED ORDER — OXYCODONE HCL 5 MG PO TABS
ORAL_TABLET | ORAL | Status: AC
  Filled 2022-01-18: qty 1

## 2022-01-18 MED ORDER — LACTATED RINGERS IV SOLN: INTRAVENOUS | Status: DC

## 2022-01-18 MED ORDER — PANTOPRAZOLE SODIUM 40 MG PO TBEC
40.0000 mg | DELAYED_RELEASE_TABLET | Freq: Every day | ORAL | Status: DC
  Administered 2022-01-19: 40 mg via ORAL
  Filled 2022-01-18: qty 1

## 2022-01-18 MED ORDER — BUPIVACAINE IN DEXTROSE 0.75-8.25 % IT SOLN
INTRATHECAL | Status: DC | PRN
  Administered 2022-01-18: 12 mg via INTRATHECAL

## 2022-01-18 MED ORDER — SODIUM CHLORIDE 0.9 % IR SOLN
Status: DC | PRN
  Administered 2022-01-18: 1000 mL

## 2022-01-18 MED ORDER — MIDAZOLAM HCL 2 MG/2ML IJ SOLN: 0.5000 mg | Freq: Once | INTRAMUSCULAR | Status: DC | PRN

## 2022-01-18 SURGICAL SUPPLY — 45 items
APL SKNCLS STERI-STRIP NONHPOA (GAUZE/BANDAGES/DRESSINGS)
BAG COUNTER SPONGE SURGICOUNT (BAG) ×2 IMPLANT
BAG SPEC THK2 15X12 ZIP CLS (MISCELLANEOUS)
BAG SPNG CNTER NS LX DISP (BAG) ×1
BAG ZIPLOCK 12X15 (MISCELLANEOUS) IMPLANT
BENZOIN TINCTURE PRP APPL 2/3 (GAUZE/BANDAGES/DRESSINGS) IMPLANT
BLADE SAW SGTL 18X1.27X75 (BLADE) ×2 IMPLANT
COVER PERINEAL POST (MISCELLANEOUS) ×2 IMPLANT
COVER SURGICAL LIGHT HANDLE (MISCELLANEOUS) ×2 IMPLANT
CUP SECTOR GRIPTON 50MM (Cup) ×1 IMPLANT
DRAPE FOOT SWITCH (DRAPES) ×2 IMPLANT
DRAPE STERI IOBAN 125X83 (DRAPES) ×2 IMPLANT
DRAPE U-SHAPE 47X51 STRL (DRAPES) ×4 IMPLANT
DRSG AQUACEL AG ADV 3.5X10 (GAUZE/BANDAGES/DRESSINGS) ×2 IMPLANT
DURAPREP 26ML APPLICATOR (WOUND CARE) ×2 IMPLANT
ELECT REM PT RETURN 15FT ADLT (MISCELLANEOUS) ×2 IMPLANT
GAUZE XEROFORM 1X8 LF (GAUZE/BANDAGES/DRESSINGS) ×1 IMPLANT
GLOVE BIO SURGEON STRL SZ7.5 (GLOVE) ×2 IMPLANT
GLOVE BIOGEL PI IND STRL 8 (GLOVE) ×2 IMPLANT
GLOVE BIOGEL PI INDICATOR 8 (GLOVE) ×2
GLOVE ECLIPSE 8.0 STRL XLNG CF (GLOVE) ×2 IMPLANT
GOWN STRL REUS W/ TWL XL LVL3 (GOWN DISPOSABLE) ×2 IMPLANT
GOWN STRL REUS W/TWL XL LVL3 (GOWN DISPOSABLE) ×8
HANDPIECE INTERPULSE COAX TIP (DISPOSABLE) ×2
HEAD FEM STD 32X+5 STRL (Hips) ×1 IMPLANT
HOLDER FOLEY CATH W/STRAP (MISCELLANEOUS) ×2 IMPLANT
KIT TURNOVER KIT A (KITS) ×1 IMPLANT
LINER ACETABULAR 32X50 (Liner) ×1 IMPLANT
PACK ANTERIOR HIP CUSTOM (KITS) ×2 IMPLANT
SCREW 6.5MMX25MM (Screw) ×1 IMPLANT
SET HNDPC FAN SPRY TIP SCT (DISPOSABLE) ×1 IMPLANT
SPONGE T-LAP 18X18 ~~LOC~~+RFID (SPONGE) ×5 IMPLANT
STAPLER VISISTAT 35W (STAPLE) ×1 IMPLANT
STEM FEM ACTIS HIGH SZ2 (Stem) ×1 IMPLANT
STRIP CLOSURE SKIN 1/2X4 (GAUZE/BANDAGES/DRESSINGS) IMPLANT
SUT ETHIBOND NAB CT1 #1 30IN (SUTURE) ×2 IMPLANT
SUT ETHILON 2 0 PS N (SUTURE) IMPLANT
SUT MNCRL AB 4-0 PS2 18 (SUTURE) IMPLANT
SUT VIC AB 0 CT1 36 (SUTURE) ×2 IMPLANT
SUT VIC AB 1 CT1 36 (SUTURE) ×2 IMPLANT
SUT VIC AB 2-0 CT1 27 (SUTURE) ×4
SUT VIC AB 2-0 CT1 TAPERPNT 27 (SUTURE) ×2 IMPLANT
TRAY FOLEY MTR SLVR 16FR STAT (SET/KITS/TRAYS/PACK) IMPLANT
TRAY FOLEY SLVR 14FR TEMP STAT (SET/KITS/TRAYS/PACK) ×1 IMPLANT
YANKAUER SUCT BULB TIP NO VENT (SUCTIONS) ×1 IMPLANT

## 2022-01-18 NOTE — Brief Op Note (Signed)
01/18/2022 ? ?11:32 AM ? ?PATIENT:  Nichole [name]  76 y.o. female ? ?PRE-OPERATIVE DIAGNOSIS:  OSTEOARTHRITIS RIGHT HIP ? ?POST-OPERATIVE DIAGNOSIS:  OSTEOARTHRITIS RIGHT HIP ? ?PROCEDURE:  Procedure(s): ?RIGHT TOTAL HIP ARTHROPLASTY ANTERIOR APPROACH (Right) ? ?SURGEON:  Surgeon(s) and Role: ?   Mcarthur Rossetti, MD - Primary ? ?PHYSICIAN ASSISTANT:  Benita Stabile, PA-C ? ?ANESTHESIA:   spinal ? ?EBL:  200 mL  ? ?COUNTS:  YES ? ?DICTATION: .Other Dictation: Dictation Number [id number] ? ?PLAN OF CARE: Admit for overnight observation ? ?PATIENT DISPOSITION:  PACU - hemodynamically stable. ?  ?Delay start of Pharmacological VTE agent (>24hrs) due to surgical blood loss or risk of bleeding: no ? ?

## 2022-01-18 NOTE — Interval H&P Note (Signed)
History and Physical Interval Note: The patient understands that she is here today for a right hip replacement to treat her right hip osteoarthritis.  There has been no acute or interval change in her medical status.  Please see H&P.  The risks and benefits of surgery been explained in detail and informed consent is obtained.  The right operative hip has been marked. ? ?01/18/2022 ?8:43 AM ? ?Nichole [name]  has presented today for surgery, with the diagnosis of OSTEOARTHRITIS RIGHT HIP.  The various methods of treatment have been discussed with the patient and family. After consideration of risks, benefits and other options for treatment, the patient has consented to  Procedure(s): ?RIGHT TOTAL HIP ARTHROPLASTY ANTERIOR APPROACH (Right) as a surgical intervention.  The patient's history has been reviewed, patient examined, no change in status, stable for surgery.  I have reviewed the patient's chart and labs.  Questions were answered to the patient's satisfaction.   ? ? ?Mcarthur Rossetti ? ? ?

## 2022-01-18 NOTE — TOC Transition Note (Addendum)
Transition of Care (TOC) - CM/SW Discharge Note ? ? ?Patient Details  ?Name: Nichole [name] ?MRN: [id number] ?Date of Birth: 1945/09/18 ? ?Transition of Care (TOC) CM/SW Contact:  ?Alexis Mizuno, LCSW ?Phone Number: ?01/18/2022, 2:25 PM ? ? ?Clinical Narrative:    ?Met with pt and spouse today to review dc plans/ needs.  Pt [place] they live [place] apt at [facility] and are set up for her to dc to the SNF/rehab area when medically cleared.  Have confirmed this with Tim Lair at [facility] [phone]) and she states bed is ready for pt anytime.  Anticipate pt likely to be cleared for dc tomorrow but have alerted MD of need for signed scripts on any narcotics/ cosign FL2 and need for summary.  Pt prefers to return to [facility] by private vehicle so no need for ambulance transport as long as PT in agreement with safety in car travel. ?RN will need to call report to [phone] (if no answer, please call [phone] for supervisor).  Will alert weekend TOC coverage. ? ? ?Final next level of care: [place] ?Barriers to Discharge: No Barriers Identified ? ? ?Patient Goals and CMS Choice ?Patient states their goals for this hospitalization and ongoing recovery are:: [place] following SNF/rehab stay ?  ?  ? ?Discharge Placement ?PASRR number recieved: 01/18/22 ?           ?Patient chooses bed at: [facility] ?Patient to be transferred to facility by: private vehicle ?Name of family member notified: spouse ?  ? ?Discharge Plan and Services ?  ?  ?           ?DME Arranged: N/A ?DME Agency: NA ?  ?  ?  ?  ?  ?  ?  ?  ? ?Social Determinants of Health (SDOH) Interventions ?  ? ? ?Readmission Risk Interventions ?   ? View : No data to display.  ?  ?  ?  ? ? ? ? ? ?

## 2022-01-18 NOTE — Transfer of Care (Signed)
Immediate Anesthesia Transfer of Care Note ? ?Patient: Nichole [name] ? ?Procedure(s) Performed: RIGHT TOTAL HIP ARTHROPLASTY ANTERIOR APPROACH (Right: Hip) ? ?Patient Location: PACU ? ?Anesthesia Type:Spinal ? ?Level of Consciousness: awake and patient cooperative ? ?Airway & Oxygen Therapy: Patient Spontanous Breathing and Patient connected to face mask ? ?Post-op Assessment: Report given to RN and Post -op Vital signs reviewed and stable ? ?Post vital signs: Reviewed and stable ? ?Last Vitals:  ?Vitals Value Taken Time  ?BP 104/57 01/18/22 1150  ?Temp    ?Pulse 63 01/18/22 1151  ?Resp 16 01/18/22 1151  ?SpO2 100 % 01/18/22 1151  ?Vitals shown include unvalidated device data. ? ?Last Pain:  ?Vitals:  ? 01/18/22 0756  ?TempSrc:   ?PainSc: 0-No pain  ?   ? ?Patients Stated Pain Goal: 4 (01/18/22 0756) ? ?Complications: No notable events documented. ?

## 2022-01-18 NOTE — Anesthesia Procedure Notes (Signed)
Spinal ? ?Patient location during procedure: OR ?End time: 01/18/2022 10:23 AM ?Reason for block: surgical anesthesia ?Staffing ?Performed: anesthesiologist  ?Anesthesiologist: Annye Asa, MD ?Resident/CRNA: Claudia Desanctis, CRNA ?Spinal Block ?Patient position: sitting ?Prep: DuraPrep and site prepped and draped ?Patient monitoring: blood pressure, continuous pulse ox, cardiac monitor and heart rate ?Approach: midline ?Location: L3-4 ?Injection technique: single-shot ?Needle ?Needle type: Pencan and Introducer  ?Needle gauge: 24 G ?Needle length: 9 cm ?Assessment ?Events: CSF return and second provider ?Additional Notes ?Pt identified in Operating room.  Monitors applied. Working IV access confirmed. Sterile prep, drape lumbar spine.  CRNA 1% lido local L2,3, several attempts os, MD 1% lido local L 3,4.  #24ga Pencan into clear CSF L 3,4.  '12mg'$  0.75% Bupivacaine with dextrose injected with asp CSF beginning and end of injection.  Patient asymptomatic, VSS, no heme aspirated, tolerated well.  Jenita Seashore, MD ?  ? ? ? ?

## 2022-01-18 NOTE — Anesthesia Postprocedure Evaluation (Signed)
Anesthesia Post Note ? ?Patient: Nichole [name] ? ?Procedure(s) Performed: RIGHT TOTAL HIP ARTHROPLASTY ANTERIOR APPROACH (Right: Hip) ? ?  ? ?Patient location during evaluation: PACU ?Anesthesia Type: Spinal ?Level of consciousness: awake and alert, patient cooperative and oriented ?Pain management: pain level controlled ?Vital Signs Assessment: post-procedure vital signs reviewed and stable ?Respiratory status: spontaneous breathing, nonlabored ventilation and respiratory function stable ?Cardiovascular status: stable and blood pressure returned to baseline ?Postop Assessment: no apparent nausea or vomiting and spinal receding ?Anesthetic complications: no ? ? ?No notable events documented. ? ?Last Vitals:  ?Vitals:  ? 01/18/22 1215 01/18/22 1230  ?BP: 119/62 126/62  ?Pulse: (!) 56 (!) 56  ?Resp: 19 19  ?Temp:  36.7 ?C  ?SpO2: 100% 100%  ?  ?Last Pain:  ?Vitals:  ? 01/18/22 1230  ?TempSrc:   ?PainSc: 3   ? ? ?  ?  ?  ?  ?  ?  ? ?Nichole [name],E. Nichole [name] ? ? ? ? ?

## 2022-01-18 NOTE — NC FL2 (Signed)
?[place] MEDICAID FL2 LEVEL OF CARE SCREENING TOOL  ?  ? ?IDENTIFICATION  ?Patient Name: ?Nichole [name] Birthdate: October 02, 1945 Sex: female Admission Date (Current Location): ?01/18/2022  ?[place] and [place] Number: ? [place] ?  Facility and Address:  ?[facility],  [address] [place], [place] ?     Provider Number: ?[id number]  ?Attending Physician Name and Address:  ?Mcarthur Rossetti,* ? Relative Name and Phone Number:  ?  ?   ?Current Level of Care: ?Hospital Recommended Level of Care: ?[place] Prior Approval Number: ?  ? ?Date Approved/Denied: ?  PASRR Number: ?[id number] A ? ?Discharge Plan: ?SNF ?  ? ?Current Diagnoses: ?Patient Active Problem List  ? Diagnosis Date Noted  ? Status post total replacement of right hip 01/18/2022  ? Unilateral primary osteoarthritis, right hip 12/24/2021  ? ? ?Orientation RESPIRATION BLADDER Height & Weight   ?  ?Self, Time, Situation, Place ? Normal Continent Weight: 143 lb (64.9 kg) ?Height:  5' 4.5" (163.8 cm)  ?BEHAVIORAL SYMPTOMS/MOOD NEUROLOGICAL BOWEL NUTRITION STATUS  ?    Continent    ?AMBULATORY STATUS COMMUNICATION OF NEEDS Skin   ?Limited Assist Verbally Other (Comment) (sugical incision only) ?  ?  ?  ?    ?     ?     ? ? ?Personal Care Assistance Level of Assistance  ?Bathing, Dressing Bathing Assistance: Limited assistance ?  ?Dressing Assistance: Limited assistance ?   ? ?Functional Limitations Info  ?    ?  ?   ? ? ?SPECIAL CARE FACTORS FREQUENCY  ?PT (By licensed PT), OT (By licensed OT)   ?  ?PT Frequency: 5x/wk ?OT Frequency: 5x/wk ?  ?  ?  ?   ? ? ?Contractures Contractures Info: Not present  ? ? ?Additional Factors Info  ?Code Status, Allergies Code Status Info: Full ?Allergies Info: NKDA ?  ?  ?  ?   ? ?Current Medications (01/18/2022):  This is the current hospital active medication list ?Current Facility-Administered Medications  ?Medication Dose Route Frequency Provider Last Rate Last Admin  ? 0.9 %  sodium  chloride infusion   Intravenous Continuous Mcarthur Rossetti, MD 75 mL/hr at 01/18/22 1414 New Bag at 01/18/22 1414  ? [START ON 01/19/2022] acetaminophen (TYLENOL) tablet 325-650 mg  325-650 mg Oral Q6H PRN Mcarthur Rossetti, MD      ? alum & mag hydroxide-simeth (MAALOX/MYLANTA) 200-200-20 MG/5ML suspension 30 mL  30 mL Oral Q4H PRN Mcarthur Rossetti, MD      ? aspirin chewable tablet 81 mg  81 mg Oral BID Mcarthur Rossetti, MD      ? Derrill Memo ON 01/19/2022] cholecalciferol (VITAMIN D3) tablet 2,000 Units  2,000 Units Oral Daily Mcarthur Rossetti, MD      ? diphenhydrAMINE (BENADRYL) 12.5 MG/5ML elixir 12.5-25 mg  12.5-25 mg Oral Q4H PRN Mcarthur Rossetti, MD      ? docusate sodium (COLACE) capsule 100 mg  100 mg Oral BID Mcarthur Rossetti, MD      ? ezetimibe (ZETIA) tablet 10 mg  10 mg Oral QPM Mcarthur Rossetti, MD      ? HYDROmorphone (DILAUDID) injection 0.5-1 mg  0.5-1 mg Intravenous Q4H PRN Mcarthur Rossetti, MD      ? menthol-cetylpyridinium (CEPACOL) lozenge 3 mg  1 lozenge Oral PRN Mcarthur Rossetti, MD      ? Or  ? phenol (CHLORASEPTIC) mouth spray 1 spray  1 spray Mouth/Throat PRN Ninfa Linden,  Lind Guest, MD      ? methocarbamol (ROBAXIN) tablet 500 mg  500 mg Oral Q6H PRN Mcarthur Rossetti, MD      ? Or  ? methocarbamol (ROBAXIN) 500 mg in dextrose 5 % 50 mL IVPB  500 mg Intravenous Q6H PRN Mcarthur Rossetti, MD 100 mL/hr at 01/18/22 1213 500 mg at 01/18/22 1213  ? methocarbamol (ROBAXIN) 500 MG/50 ML IVPB           ? metoCLOPramide (REGLAN) tablet 5-10 mg  5-10 mg Oral Q8H PRN Mcarthur Rossetti, MD      ? Or  ? metoCLOPramide (REGLAN) injection 5-10 mg  5-10 mg Intravenous Q8H PRN Mcarthur Rossetti, MD      ? ondansetron [facility]) tablet 4 mg  4 mg Oral Q6H PRN Mcarthur Rossetti, MD      ? Or  ? ondansetron [facility]) injection 4 mg  4 mg Intravenous Q6H PRN Mcarthur Rossetti, MD      ? oxyCODONE (Oxy  IR/ROXICODONE) 5 MG immediate release tablet           ? oxyCODONE (Oxy IR/ROXICODONE) immediate release tablet 10-15 mg  10-15 mg Oral Q4H PRN Mcarthur Rossetti, MD      ? oxyCODONE (Oxy IR/ROXICODONE) immediate release tablet 5-10 mg  5-10 mg Oral Q4H PRN Mcarthur Rossetti, MD      ? Derrill Memo ON 01/19/2022] pantoprazole (PROTONIX) EC tablet 40 mg  40 mg Oral Daily Mcarthur Rossetti, MD      ? Derrill Memo ON 01/19/2022] PARoxetine (PAXIL-CR) 24 hr tablet 25 mg  25 mg Oral Daily Mcarthur Rossetti, MD      ? polyethylene glycol (MIRALAX / GLYCOLAX) packet 17 g  17 g Oral Daily PRN Mcarthur Rossetti, MD      ? ? ? ?Discharge Medications: ?Please see discharge summary for a list of discharge medications. ? ?Relevant Imaging Results: ? ?Relevant Lab Results: ? ? ?Additional Information ?SS# [id number] ? ?Sharonna Vinje, LCSW ? ? ? ? ?

## 2022-01-18 NOTE — Anesthesia Preprocedure Evaluation (Addendum)
Anesthesia Evaluation  ?Patient identified by MRN, date of birth, ID band ?Patient awake ? ? ? ?Reviewed: ?Allergy & Precautions, NPO status , Patient's Chart, lab work & pertinent test results ? ?History of Anesthesia Complications ?Negative for: history of anesthetic complications ? ?Airway ?Mallampati: I ? ?TM Distance: >3 FB ?Neck ROM: Full ? ? ? Dental ? ?(+) Dental Advisory Given ?  ?Pulmonary ?neg pulmonary ROS,  ?  ?breath sounds clear to auscultation ? ? ? ? ? ? Cardiovascular ?negative cardio ROS ? ? ?Rhythm:Regular Rate:Normal ? ? ?  ?Neuro/Psych ?Anxiety negative neurological ROS ?   ? GI/Hepatic ?negative GI ROS, Neg liver ROS,   ?Endo/Other  ?negative endocrine ROS ? Renal/GU ?negative Renal ROS  ? ?  ?Musculoskeletal ? ?(+) Arthritis ,  ? Abdominal ?  ?Peds ? Hematology ?negative hematology ROS ?(+)   ?Anesthesia Other Findings ? ? Reproductive/Obstetrics ? ?  ? ? ? ? ? ? ? ? ? ? ? ? ? ?  ?  ? ? ? ? ? ? ? ?Anesthesia Physical ?Anesthesia Plan ? ?ASA: 2 ? ?Anesthesia Plan: Spinal  ? ?Post-op Pain Management: Tylenol PO (pre-op)*  ? ?Induction: Intravenous ? ?PONV Risk Score and Plan: 2 and Ondansetron and Treatment may vary due to age or medical condition ? ?Airway Management Planned: Natural Airway and Simple Face Mask ? ?Additional Equipment: None ? ?Intra-op Plan:  ? ?Post-operative Plan:  ? ?Informed Consent: I have reviewed the patients History and Physical, chart, labs and discussed the procedure including the risks, benefits and alternatives for the proposed anesthesia with the patient or authorized representative who has indicated his/her understanding and acceptance.  ? ? ? ?Dental advisory given ? ?Plan Discussed with: CRNA and Surgeon ? ?Anesthesia Plan Comments:   ? ? ? ? ? ?Anesthesia Quick Evaluation ? ?

## 2022-01-18 NOTE — Plan of Care (Signed)
Problem: Education: ?Goal: Knowledge of the prescribed therapeutic regimen will improve ?Outcome: Progressing ?  ?Problem: Activity: ?Goal: Ability to avoid complications of mobility impairment will improve ?Outcome: Progressing ?  ?Problem: Pain Management: ?Goal: Pain level will decrease with appropriate interventions ?Outcome: Progressing ?  ?Ivan Anchors, RN ?01/18/22 ?8:20 PM ? ?

## 2022-01-18 NOTE — Evaluation (Signed)
Physical Therapy Evaluation ?Patient Details ?Name: Nichole [name] ?MRN: [id number] ?DOB: [birth year] ?Today's Date: 01/18/2022 ? ?History of Present Illness ? Pt is a 76yo F presenting s/p R-THA, AA on 01/18/22. PMH: OA  ?Clinical Impression ? Nichole [name] is a 76 y.o. female POD 0 s/p R-THA, AA. Patient reports independence with mobility at baseline and lives at [facility] independent living and is interested in discharging to rehab facility there. Patient is now limited by functional impairments (see PT problem list below) and requires min gaurd for transfers and gait with RW. Patient was able to ambulate 20 feet with RW and min guard assist +2 for recliner follow. Patient instructed in exercise to facilitate ROM and circulation to manage edema. Patient will benefit from continued skilled PT interventions to address impairments and progress towards PLOF. Acute PT will follow to progress mobility in preparation for safe discharge.   ?   ? ?Recommendations for follow up therapy are one component of a multi-disciplinary discharge planning process, led by the attending physician.  Recommendations may be updated based on patient status, additional functional criteria and insurance authorization. ? ?Follow Up Recommendations Follow physician's recommendations for discharge plan and follow up therapies (Pt is from [place] and would like to utilize rehab facilities there, TOC is aware.) ? ?  ?Assistance Recommended at Discharge Intermittent Supervision/Assistance  ?Patient can return home with the following ? A little help with walking and/or transfers;A little help with bathing/dressing/bathroom;Assistance with cooking/housework;Assist for transportation;Help with stairs or ramp for entrance ? ?  ?Equipment Recommendations None recommended by PT  ?Recommendations for Other Services ?    ?  ?Functional Status Assessment Patient has had a recent decline in their functional status and demonstrates the ability to make  significant improvements in function in a reasonable and predictable amount of time.  ? ?  ?Precautions / Restrictions Precautions ?Precautions: Fall ?Restrictions ?Weight Bearing Restrictions: Yes ?RLE Weight Bearing: Weight bearing as tolerated  ? ?  ? ?Mobility ? Bed Mobility ?Overal bed mobility: Needs Assistance ?Bed Mobility: Supine to Sit ?  ?  ?Supine to sit: Min guard ?  ?  ?General bed mobility comments: Pt min guard for safety only, no physical assist required. ?  ? ?Transfers ?Overall transfer level: Needs assistance ?Equipment used: Rolling walker (2 wheels) ?Transfers: Sit to/from Stand ?Sit to Stand: From elevated surface, Min guard ?  ?  ?  ?  ?  ?General transfer comment: Pt min guard for safety only from elevated surface, no physical assist required; VCs for sequencing and hand placement to power up. ?  ? ?Ambulation/Gait ?Ambulation/Gait assistance: Min guard, +2 safety/equipment ?Gait Distance (Feet): 20 Feet ?Assistive device: Rolling walker (2 wheels) ?Gait Pattern/deviations: Step-to pattern ?Gait velocity: decreased ?  ?  ?General Gait Details: Pt ambulated with RW with min guard assist +2 for recliner follow, no physical assist required, no overt LOB noted. Pt did report mild lightheadedness at end of ambulation task but this resolved quickly with seated rest. ? ?Stairs ?  ?  ?  ?  ?  ? ?Wheelchair Mobility ?  ? ?Modified Rankin (Stroke Patients Only) ?  ? ?  ? ?Balance Overall balance assessment: Needs assistance ?Sitting-balance support: Feet supported, No upper extremity supported ?Sitting balance-Leahy Scale: Fair ?  ?  ?Standing balance support: Reliant on assistive device for balance, During functional activity, Bilateral upper extremity supported ?Standing balance-Leahy Scale: Poor ?  ?  ?  ?  ?  ?  ?  ?  ?  ?  ?  ?  ?   ? ? ? ?  Pertinent Vitals/Pain Pain Assessment ?Pain Assessment: 0-10 ?Pain Score: 7  ?Pain Location: r hip ?Pain Descriptors / Indicators: Operative site guarding,  Discomfort ?Pain Intervention(s): Limited activity within patient's tolerance, Monitored during session, Repositioned  ? ? ?Home Living Family/patient expects to be discharged to:: Other (Comment) [facility], rehab there) ?Living Arrangements: Spouse/significant other ?  ?  ?  ?  ?  ?  ?  ?  ?Additional Comments: one story, walk-in shower, higher toilets, has a RW but she hasn't been using it  ?  ?Prior Function Prior Level of Function : Independent/Modified Independent ?  ?  ?  ?  ?  ?  ?Mobility Comments: ind ?ADLs Comments: ind ?  ? ? ?Hand Dominance  ?   ? ?  ?Extremity/Trunk Assessment  ? Upper Extremity Assessment ?Upper Extremity Assessment: Overall WFL for tasks assessed ?  ? ?Lower Extremity Assessment ?Lower Extremity Assessment: RLE deficits/detail;LLE deficits/detail ?RLE Deficits / Details: MMT ank DF/PF 5/5 ?RLE Sensation: WNL ?LLE Deficits / Details: MMT ank DF/PF 5/5 ?LLE Sensation: WNL ?  ? ?Cervical / Trunk Assessment ?Cervical / Trunk Assessment: Kyphotic  ?Communication  ? Communication: No difficulties  ?Cognition Arousal/Alertness: Awake/alert ?Behavior During Therapy: [facility] for tasks assessed/performed ?Overall Cognitive Status: Within Functional Limits for tasks assessed ?  ?  ?  ?  ?  ?  ?  ?  ?  ?  ?  ?  ?  ?  ?  ?  ?  ?  ?  ? ?  ?General Comments General comments (skin integrity, edema, etc.): Husband Mikki Santee present for duration of session ? ?  ?Exercises Total Joint Exercises ?Ankle Circles/Pumps: AROM, 20 reps, Both ?Other Exercises ?Other Exercises: Educated on appropriate progression for walking program  ? ?Assessment/Plan  ?  ?PT Assessment Patient needs continued PT services  ?PT Problem List Decreased strength;Decreased range of motion;Decreased activity tolerance;Decreased balance;Decreased mobility;Decreased coordination;Decreased knowledge of use of DME;Pain ? ?   ?  ?PT Treatment Interventions Neuromuscular re-education;Balance training;Therapeutic exercise;Therapeutic  activities;Functional mobility training;Stair training;Gait training;DME instruction   ? ?PT Goals (Current goals can be found in the Care Plan section)  ?Acute Rehab PT Goals ?Patient Stated Goal: Walking more comfortably, water aerobics, yoga ?PT Goal Formulation: With patient ?Time For Goal Achievement: 01/25/22 ?Potential to Achieve Goals: Good ? ?  ?Frequency 7X/week ?  ? ? ?Co-evaluation   ?  ?  ?  ?  ? ? ?  ?AM-PAC PT "6 Clicks" Mobility  ?Outcome Measure Help needed turning from your back to your side while in a flat bed without using bedrails?: None ?Help needed moving from lying on your back to sitting on the side of a flat bed without using bedrails?: A Little ?Help needed moving to and from a bed to a chair (including a wheelchair)?: A Little ?Help needed standing up from a chair using your arms (e.g., wheelchair or bedside chair)?: A Little ?Help needed to walk in hospital room?: A Little ?Help needed climbing 3-5 steps with a railing? : A Lot ?6 Click Score: 18 ? ?  ?End of Session Equipment Utilized During Treatment: Gait belt ?Activity Tolerance: Patient tolerated treatment well ?Patient left: in chair;with call bell/phone within reach;with chair alarm set;with family/visitor present;with SCD's reapplied ?Nurse Communication: Mobility status ?PT Visit Diagnosis: Difficulty in walking, not elsewhere classified (R26.2) ?  ? ?Time: 2409-7353 ?PT Time Calculation (min) (ACUTE ONLY): 22 min ? ? ?Charges:   PT Evaluation ?$PT Eval Low Complexity: 1 Low ?  ?  ?   ? ? ?  Coolidge Breeze, PT, DPT ?[facility] Rehabilitation Department ?Office: [phone] ?Pager: [phone] ? ?Coolidge Breeze ?01/18/2022, 6:31 PM ? ?

## 2022-01-19 DIAGNOSIS — M1611 Unilateral primary osteoarthritis, right hip: Secondary | ICD-10-CM | POA: Diagnosis not present

## 2022-01-19 LAB — CBC
HCT: 32.3 % — ABNORMAL LOW (ref 36.0–46.0)
Hemoglobin: 10.4 g/dL — ABNORMAL LOW (ref 12.0–15.0)
MCH: 30.1 pg (ref 26.0–34.0)
MCHC: 32.2 g/dL (ref 30.0–36.0)
MCV: 93.4 fL (ref 80.0–100.0)
Platelets: 235 10*3/uL (ref 150–400)
RBC: 3.46 MIL/uL — ABNORMAL LOW (ref 3.87–5.11)
RDW: 13.1 % (ref 11.5–15.5)
WBC: 8.8 10*3/uL (ref 4.0–10.5)
nRBC: 0 % (ref 0.0–0.2)

## 2022-01-19 LAB — TYPE AND SCREEN
ABO/RH(D): O POS
Antibody Screen: NEGATIVE

## 2022-01-19 MED ORDER — METHOCARBAMOL 500 MG PO TABS: 500.0000 mg | ORAL_TABLET | Freq: Four times a day (QID) | ORAL | 0 refills | Status: DC | PRN

## 2022-01-19 MED ORDER — ASPIRIN 81 MG PO CHEW: 81.0000 mg | CHEWABLE_TABLET | Freq: Two times a day (BID) | ORAL | 0 refills | Status: DC

## 2022-01-19 MED ORDER — OXYCODONE HCL 5 MG PO TABS
5.0000 mg | ORAL_TABLET | ORAL | 0 refills | Status: DC | PRN
Start: 2022-01-19 — End: 2022-01-28

## 2022-01-19 NOTE — Progress Notes (Signed)
Patient husband coming to transfer patient back to [facility] room 156. ?

## 2022-01-19 NOTE — Op Note (Signed)
NAME: Nichole [name], Nichole [name]. ?MEDICAL RECORD NO: [id number] ?ACCOUNT NO: [id number] ?DATE OF BIRTH: 09/29/1945 ?FACILITY: [facility] ?LOCATION: [facility] ?PHYSICIAN: Lind Guest. Ninfa Linden, MD ? ?Operative Report  ? ?DATE OF PROCEDURE: 01/18/2022 ? ?PREOPERATIVE DIAGNOSIS:  Primary osteoarthritis and degenerative joint disease, right hip. ? ?POSTOPERATIVE DIAGNOSIS:  Primary osteoarthritis and degenerative joint disease, right hip. ? ?PROCEDURE:  Right total hip arthroplasty through direct anterior approach. ? ?IMPLANTS:  DePuy sector Gription acetabular component size 50 with a single screw, size 32+0 neutral polyethylene liner, size 2 ACTIS femoral component with high offset, size 32+5 metal hip ball. ? ?SURGEON:  Lind Guest. Ninfa Linden, MD ? ?ASSISTANT:  Erskine Emery, PA-C ? ?ANESTHESIA:  Spinal. ? ?ANTIBIOTICS:  2 g IV Ancef. ? ?BLOOD LOSS:  Less than 200 mL ? ?COMPLICATIONS:  None. ? ?INDICATIONS:  The patient is a very pleasant 76 year old active female with debilitating pain involving her right hip.  Her x-rays did not show any significant arthritic changes and so an MRI was obtained and it showed severe arthritis in the right hip  ?with edema in the femoral head and neck.  There is likely evidence of some osteonecrosis as well, but there was mainly osteoarthritis.  Given her daily pain and the severity of this and the detrimental impact this is having on her quality of life, her  ?activities of daily living and her mobility, she does wish to proceed with a hip replacement on the right side and we agree with this as well based on her clinical exam and radiographic findings.  We did discuss the risk of acute blood loss anemia, nerve ? or vessel injury, fracture, infection, dislocation, DVT, implant failure, leg length differences and skin and soft tissue issues.  We talked about our goals being decreased pain, improve mobility and overall improve quality of life. ? ?DESCRIPTION OF PROCEDURE:  After informed consent was  obtained, appropriate right hip was marked.  She was brought to the operating room and sat up on the stretcher where spinal anesthesia was obtained.  She was laid in supine position on the stretcher.  ? Foley catheter was placed.  I was able to assess her leg lengths and they were equal.  Traction boots were placed on both her feet and she was placed supine on the Hana fracture table. The perineal post in place and both legs in line skeletal traction  ?device and no traction applied.  Her right operative hip was prepped and draped with DuraPrep and sterile drapes.  A timeout was called.  She was identified as correct patient and correct right hip.  I then made an incision just inferior and posterior to ? the anterior iliac spine and carried this obliquely down the leg.  We dissected down tensor fascia lata muscle.  Tensor fascia was then divided longitudinally to proceed with direct anterior approach to the hip.  We identified and cauterized circumflex  ?vessels and identified the hip capsule and opened the hip capsule in L-type format finding quite significant joint effusion.  We placed curved retractors around the medial and lateral femoral neck and made a femoral neck cut with an oscillating saw,  ?proximal to the lesser trochanter and completed this with an osteotome. I placed a corkscrew guide in the femoral head and removed the femoral head its entirety and did find an area devoid of cartilage.  I then placed a bent Hohmann over the medial  ?acetabular rim and removed remnants of acetabular labrum and other debris.  I then began reaming  under direct visualization from a size 43 reamer in a stepwise increments going to a size 49 reamer with all reamers placed under direct visualization.  Last ? reamer was also placed under direct fluoroscopy, so I could obtain my depth of reaming, my inclination and anteversion.  I then placed real DePuy sector Gription acetabular component size 50 and it was nice tight fit  with slight overhang and went with a ? single dome screw.  We then placed a 32+0 neutral polyethylene liner for that size acetabular component.  Attention was then turned to the femur.  With the leg externally rotated to 120 degrees, extended and adducted, we were able to place a Mueller  ?retractor medially and Hohmann retractor behind the greater trochanter.  We used a box cutting osteotome to enter femoral canal and then began broaching using the ACTIS broaching system from a size 0 going to a size 2.  With a size 2 in place, we trialed ? standard offset femoral neck and a 32+1 hip ball, reduced this in acetabulum and based on radiographic assessment and clinical assessment, we felt like we needed more offset and slightly more leg length.  We dislocated the hip, removed the trial  ?components.  We placed the real ACTIS femoral component size 2, but one with high offset and went with a real 32+5 metal hip ball. We reduced this in acetabulum.  We felt that we had accomplished what we wanted to in terms of stability and leg length as  ?well as offset.  It felt stable on clinical exam as well and we assessed this radiographically.  We then irrigated the soft tissue with normal saline solution using pulsatile lavage.  The joint capsule was closed with interrupted #1 Ethibond suture  ?followed by #1 Vicryl to close the tensor fascia.  0 Vicryl was used to close the deep tissue and 2-0 Vicryl was used to close subcutaneous tissue.  The skin was closed with staples.  An Aquacel dressing was applied.  She was taken off the Hana table and ? taken to recovery room in stable condition with all final counts being correct.  No complications noted.  Of note, Erskine Emery, PA-C, assisted from the beginning to of this case and his assistance was medically necessary and crucial for helping manage ? soft tissues and retracting soft tissues as well as assisting in guiding implant placement and layered closure of the  wound. ? ? ?VAI ?D: 01/18/2022 11:31:21 am T: 01/19/2022 12:25:00 am  ?JOB: [id number]/ [id number]  ?

## 2022-01-19 NOTE — TOC Transition Note (Signed)
Transition of Care (TOC) - CM/SW Discharge Note ? ? ?Patient Details  ?Name: Nichole [name] ?MRN: [id number] ?Date of Birth: November 26, 1945 ? ?Transition of Care (TOC) CM/SW Contact:  ?Ross Ludwig, LCSW ?Phone Number: ?01/19/2022, 10:40 AM ? ? ?Clinical Narrative:    ? ?CSW spoke to weekend supervisor on call at [facility].  Per [facility], she can discharge today if she is medically ready for discharge.  CSW updated patient's husband he will transport patient back to SNF.  Patient will be going to room 156 at [facility], nurse call report to [phone], if unable to reach someone call [phone] for the nurse supervisor. ? ? ?Final next level of care: [facility] ?Barriers to Discharge: Barriers Resolved ? ? ?Patient Goals and CMS Choice ?Patient states their goals for this hospitalization and ongoing recovery are:: To return back to [facility] after rehab. ?CMS Medicare.gov Compare Post Acute Care list provided to:: Patient ?Choice offered to / list presented to : Patient ? ?Discharge Placement ?PASRR number recieved: 01/18/22 ?Existing PASRR number confirmed : 01/18/22          ?Patient chooses bed at: [facility] ?Patient to be transferred to facility by: Husband will transport. ?Name of family member notified: Husband Herbie Baltimore ?Patient and family notified of of transfer: 01/19/22 ? ?Discharge Plan and Services ?  ?  ?           ?DME Arranged: N/A ?DME Agency: NA ?  ?  ?  ?  ?  ?  ?  ?  ? ?Social Determinants of Health (SDOH) Interventions ?  ? ? ?Readmission Risk Interventions ?   ? View : No data to display.  ?  ?  ?  ? ? ? ? ? ?

## 2022-01-19 NOTE — Discharge Summary (Signed)
?Patient ID: ?Nichole [name] ?MRN: [id number] ?DOB/AGE: [birth year] 76 y.o. ? ?Admit date: 01/18/2022 ?Discharge date: 01/19/2022 ? ?Admission Diagnoses:  ?Principal Problem: ?  Unilateral primary osteoarthritis, right hip ?Active Problems: ?  Status post total replacement of right hip ? ? ?Discharge Diagnoses:  ?Same ? ?Past Medical History:  ?Diagnosis Date  ? Anxiety   ? Arthritis   ? HDL deficiency   ? Pre-diabetes   ? Diet controlled  ? ? ?Surgeries: Procedure(s): ?RIGHT TOTAL HIP ARTHROPLASTY ANTERIOR APPROACH on 01/18/2022 ?  ?Consultants:  ? ?Discharged Condition: Improved ? ?Hospital Course: Nichole [name] is an 76 y.o. female who was admitted 01/18/2022 for operative treatment ofUnilateral primary osteoarthritis, right hip. Patient has severe unremitting pain that affects sleep, daily activities, and work/hobbies. After pre-op clearance the patient was taken to the operating room on 01/18/2022 and underwent  Procedure(s): ?RIGHT TOTAL HIP ARTHROPLASTY ANTERIOR APPROACH.   ? ?Patient was given perioperative antibiotics:  ?Anti-infectives (From admission, onward)  ? ? Start     Dose/Rate Route Frequency Ordered Stop  ? 01/18/22 0745  ceFAZolin (ANCEF) IVPB 2g/100 mL premix       ? 2 g ?200 mL/hr over 30 Minutes Intravenous On call to O.R. 01/18/22 0736 01/18/22 1021  ? ?  ?  ? ?Patient was given sequential compression devices, early ambulation, and chemoprophylaxis to prevent DVT. ? ?Patient benefited maximally from hospital stay and there were no complications.   ? ?Recent vital signs: Patient Vitals for the past 24 hrs: ? BP Temp Temp src Pulse Resp SpO2  ?01/19/22 0553 (!) 114/48 98.9 ?F (37.2 ?C) Oral 67 17 99 %  ?01/19/22 0131 (!) 100/45 98.8 ?F (37.1 ?C) Oral 66 16 95 %  ?01/18/22 1959 117/73 97.7 ?F (36.5 ?C) Oral 62 18 96 %  ?01/18/22 1805 (!) 94/42 -- -- (!) 56 18 99 %  ?01/18/22 1552 (!) 110/50 97.8 ?F (36.6 ?C) Oral (!) 53 16 99 %  ?01/18/22 1339 110/60 97.6 ?F (36.4 ?C) Oral (!) 51 18 100 %  ?01/18/22 1230  126/62 98 ?F (36.7 ?C) -- (!) 56 19 100 %  ?01/18/22 1215 119/62 -- -- (!) 56 19 100 %  ?01/18/22 1200 (!) 106/58 -- -- 62 (!) 22 100 %  ?01/18/22 1150 (!) 104/57 98.1 ?F (36.7 ?C) -- 63 17 100 %  ?  ? ?Recent laboratory studies:  ?Recent Labs  ?  01/19/22 ?0329  ?WBC 8.8  ?HGB 10.4*  ?HCT 32.3*  ?PLT 235  ? ? ? ?Discharge Medications:   ?Allergies as of 01/19/2022   ?No Known Allergies ?  ? ?  ?Medication List  ?  ? ?TAKE these medications   ? ?Advil Dual Action 125-250 MG Tabs ?Generic drug: Ibuprofen-Acetaminophen ?Take 2 tablets by mouth daily as needed (pain). ?  ?alendronate 70 MG tablet ?Commonly known as: FOSAMAX ?Take 1 tablet by mouth weekly 30 minutes before the first food, beverage, or medicine of the day with plain water ?(1 tablet 30 minutes before the first food, beverage or medicine of the day with plain water Orally Once a week 30 day(s)) ?  ?aspirin 81 MG chewable tablet ?Chew 1 tablet (81 mg total) by mouth 2 (two) times daily. ?  ?CALCIUM PO ?Take 1,000 mg by mouth at bedtime. ?  ?ezetimibe 10 MG tablet ?Commonly known as: ZETIA ?Take 10 mg by mouth every evening. ?  ?methocarbamol 500 MG tablet ?Commonly known as: ROBAXIN ?Take 1 tablet (500 mg total) by mouth  every 6 (six) hours as needed for muscle spasms. ?  ?oxyCODONE 5 MG immediate release tablet ?Commonly known as: Oxy IR/ROXICODONE ?Take 1-2 tablets (5-10 mg total) by mouth every 4 (four) hours as needed for moderate pain (pain score 4-6). ?  ?PARoxetine 25 MG 24 hr tablet ?Commonly known as: PAXIL-CR ?Take 25 mg by mouth daily. ?  ?simvastatin 40 MG tablet ?Commonly known as: ZOCOR ?Take 40 mg by mouth every evening. ?  ?sodium chloride 0.65 % Soln nasal spray ?Commonly known as: OCEAN ?Place 1 spray into both nostrils as needed for congestion. ?  ?SYSTANE OP ?Place 1 drop into both eyes 2 (two) times daily. ?  ?Vitamin D 50 MCG (2000 UT) tablet ?Take 2,000 Units by mouth daily. ?  ? ?  ? ?  ?  ? ? ?  ?Durable Medical Equipment  ?(From  admission, onward)  ?  ? ? ?  ? ?  Start     Ordered  ? 01/18/22 1303  DME 3 n 1  Once       ? 01/18/22 1302  ? 01/18/22 1303  DME Walker rolling  Once       ?Question Answer Comment  ?Walker: With 5 Inch Wheels   ?Patient needs a walker to treat with the following condition Status post total replacement of right hip   ?  ? 01/18/22 1302  ? ?  ?  ? ?  ? ? ?Diagnostic Studies: DG Pelvis Portable ? ?Result Date: 01/18/2022 ?CLINICAL DATA:  Follow-up total hip replacement EXAM: PORTABLE PELVIS 1-2 VIEWS COMPARISON:  09/06/2021 FINDINGS: Total hip arthroplasty on the right. Components appear well positioned. No radiographically detectable complication. No other regional abnormality. IMPRESSION: Good appearance following total hip replacement on the right. Electronically Signed   By: Nelson Chimes M.D.   On: 01/18/2022 12:03  ? ?DG C-Arm 1-60 Min-No Report ? ?Result Date: 01/18/2022 ?Fluoroscopy was utilized by the requesting physician.  No radiographic interpretation.  ? ?DG C-Arm 1-60 Min-No Report ? ?Result Date: 01/18/2022 ?Fluoroscopy was utilized by the requesting physician.  No radiographic interpretation.  ? ?DG HIP UNILAT WITH PELVIS 1V RIGHT ? ?Result Date: 01/18/2022 ?CLINICAL DATA:  Right total hip replacement EXAM: DG HIP (WITH OR WITHOUT PELVIS) 1V RIGHT COMPARISON:  Pelvis radiograph 07/24/2021 FINDINGS: Intraoperative image during right total hip arthroplasty. Normal alignment without evidence of complication on single frontal view. IMPRESSION: Intraoperative image during right total hip arthroplasty. No evidence of immediate complication on single frontal view. Electronically Signed   By: Maurine Simmering M.D.   On: 01/18/2022 11:54   ? ?Disposition: Discharge disposition: 03-Skilled Nursing Facility ? ? ? ? ? ? ? ? ? Follow-up Information   ? ? Mcarthur Rossetti, MD Follow up in 2 week(s).   ?Specialty: Orthopedic Surgery ?Contact information: ?[address] ?[place] [address] ?[phone] ? ? ?  ?  ? ?   ?  ? ?  ? ? ? ?Signed: ?Mcarthur Rossetti ?01/19/2022, 10:25 AM ? ?  ?

## 2022-01-19 NOTE — Progress Notes (Addendum)
Physical Therapy Treatment ?Patient Details ?Name: Nichole [name] ?MRN: [id number] ?DOB: [birth year] ?Today's Date: 01/19/2022 ? ? ?History of Present Illness Pt is a 76yo F presenting s/p R-THA, AA on 01/18/22. PMH: OA ? ?  ?PT Comments  ? ? Pt progressing well with PT. Reviewed THA HEP and progression. Pt is ready to d/c to SNF at [facility]   ?Recommendations for follow up therapy are one component of a multi-disciplinary discharge planning process, led by the attending physician.  Recommendations may be updated based on patient status, additional functional criteria and insurance authorization. ? ?Follow Up Recommendations ? Follow physician's recommendations for discharge plan and follow up therapies ?  ?  ?Assistance Recommended at Discharge Intermittent Supervision/Assistance  ?Patient can return home with the following A little help with walking and/or transfers;A little help with bathing/dressing/bathroom;Assistance with cooking/housework;Assist for transportation;Help with stairs or ramp for entrance ?  ?Equipment Recommendations ? None recommended by PT  ?  ?Recommendations for Other Services   ? ? ?  ?Precautions / Restrictions Precautions ?Precautions: Fall ?Restrictions ?RLE Weight Bearing: Weight bearing as tolerated  ?  ? ?Mobility ? Bed Mobility ? ? ?  ?  ?  ? ?Stairs ?  ?  ?  ?  ?  ? ? ?Wheelchair Mobility ?  ? ?Modified Rankin (Stroke Patients Only) ?  ? ? ?  ?Balance   ?  ?  ?  ?  ?  ?  ?  ?  ?  ?  ?  ?  ?  ?  ?  ?  ?  ?  ?  ? ?  ?Cognition Arousal/Alertness: Awake/alert ?Behavior During Therapy: [facility] for tasks assessed/performed ?Overall Cognitive Status: Within Functional Limits for tasks assessed ?  ?  ?  ?  ?  ?  ?  ?  ?  ?  ?  ?  ?  ?  ?  ?  ?  ?  ?  ? ?  ?Exercises Total Joint Exercises ?Ankle Circles/Pumps: AROM, 20 reps, Both ?Quad Sets: AROM, Both, 10 reps ?Gluteal Sets: AROM, Strengthening, Both, 10 reps ?Heel Slides: AAROM, Right, 10 reps ?Hip ABduction/ADduction: AAROM, Right, 10 reps ?Long Arc  Quad: AROM, Right, 10 reps, Seated ?Other Exercises ?Other Exercises: Educated on appropriate progression for walking program/standingexercises. pt will receive additional instruction at SNF/[facility] ? ?  ?General Comments   ?  ?  ? ?Pertinent Vitals/Pain Pain Assessment ?Pain Assessment: 0-10 ?Pain Score: 3  ?Pain Location: r hip ?Pain Descriptors / Indicators: Operative site guarding, Discomfort ?Pain Intervention(s): Limited activity within patient's tolerance, Monitored during session, Premedicated before session, Repositioned  ? ? ?Home Living   ?  ?  ?  ?  ?  ?  ?  ?  ?  ?   ?  ?Prior Function    ?  ?  ?   ? ?PT Goals (current goals can now be found in the care plan section) Acute Rehab PT Goals ?Patient Stated Goal: Walking more comfortably, water aerobics, yoga ?PT Goal Formulation: With patient ?Time For Goal Achievement: 01/25/22 ?Potential to Achieve Goals: Good ?Progress towards PT goals: Progressing toward goals ? ?  ?Frequency ? ? ? 7X/week ? ? ? ?  ?PT Plan Current plan remains appropriate  ? ? ?Co-evaluation   ?  ?  ?  ?  ? ?  ?AM-PAC PT "6 Clicks" Mobility   ?Outcome Measure ? Help needed turning from your back to your side while in a flat bed  without using bedrails?: None ?Help needed moving from lying on your back to sitting on the side of a flat bed without using bedrails?: A Little ?Help needed moving to and from a bed to a chair (including a wheelchair)?: A Little ?Help needed standing up from a chair using your arms (e.g., wheelchair or bedside chair)?: A Little ?Help needed to walk in hospital room?: A Little ?Help needed climbing 3-5 steps with a railing? : A Little ?6 Click Score: 19 ? ?  ?End of Session Equipment Utilized During Treatment: Gait belt ?Activity Tolerance: Patient tolerated treatment well ?Patient left: in chair;with call bell/phone within reach ?Nurse Communication: Mobility status ?PT Visit Diagnosis: Difficulty in walking, not elsewhere classified (R26.2) ?  ? ? ?Time:  4503-8882 ?PT Time Calculation (min) (ACUTE ONLY): 13 min ? ?Charges:  $Gait Training: 8-22 mins ?$Therapeutic Exercise: 8-22 mins          ?          ? Baxter Flattery, PT ? ?Acute Rehab Dept [facility]) [phone] ?Pager [phone] ? ?01/19/2022 ? ? ? ?Harjas Biggins ?01/19/2022, 2:27 PM ? ?

## 2022-01-19 NOTE — Progress Notes (Signed)
Subjective: ?1 Day Post-Op Procedure(s) (LRB): ?RIGHT TOTAL HIP ARTHROPLASTY ANTERIOR APPROACH (Right) ?Patient reports pain as moderate.   ? ?Objective: ?Vital signs in last 24 hours: ?Temp:  [97.6 ?F (36.4 ?C)-98.9 ?F (37.2 ?C)] 98.9 ?F (37.2 ?C) (05/06 4854) ?Pulse Rate:  [51-67] 67 (05/06 0553) ?Resp:  [16-22] 17 (05/06 0553) ?BP: (94-126)/(42-73) 114/48 (05/06 0553) ?SpO2:  [95 %-100 %] 99 % (05/06 0553) ? ?Intake/Output from previous day: ?05/05 0701 - 05/06 0700 ?In: 2231.9 [P.O.:600; I.V.:1381.9; IV Piggyback:250] ?Out: 1600 [Urine:1400; Blood:200] ?Intake/Output this shift: ?Total I/O ?In: 1317.7 [P.O.:120; I.V.:1197.7] ?Out: -  ? ?Recent Labs  ?  01/19/22 ?0329  ?HGB 10.4*  ? ?Recent Labs  ?  01/19/22 ?0329  ?WBC 8.8  ?RBC 3.46*  ?HCT 32.3*  ?PLT 235  ? ?No results for input(s): NA, K, CL, CO2, BUN, CREATININE, GLUCOSE, CALCIUM in the last 72 hours. ?No results for input(s): LABPT, INR in the last 72 hours. ? ?Sensation intact distally ?Intact pulses distally ?Dorsiflexion/Plantar flexion intact ?Incision: dressing C/D/I ? ? ?Assessment/Plan: ?1 Day Post-Op Procedure(s) (LRB): ?RIGHT TOTAL HIP ARTHROPLASTY ANTERIOR APPROACH (Right) ?Up with therapy ?Discharge to SNF - can go to [facility] today ? ? ? ? ? ?Mcarthur Rossetti ?01/19/2022, 10:23 AM ? ?

## 2022-01-19 NOTE — Discharge Instructions (Signed)

## 2022-01-19 NOTE — Progress Notes (Signed)
Physical Therapy Treatment ?Patient Details ?Name: Nichole [name] ?MRN: [id number] ?DOB: December 14, 1945 ?Today's Date: 01/19/2022 ? ? ?History of Present Illness Pt is a 76yo F presenting s/p R-THA, AA on 01/18/22. PMH: OA ? ?  ?PT Comments  ? ? Pt is progressing well. Pt desires one more session of PT prior to d/c to SNF at [facility]    ?Recommendations for follow up therapy are one component of a multi-disciplinary discharge planning process, led by the attending physician.  Recommendations may be updated based on patient status, additional functional criteria and insurance authorization. ? ?Follow Up Recommendations ? Follow physician's recommendations for discharge plan and follow up therapies ?  ?  ?Assistance Recommended at Discharge Intermittent Supervision/Assistance  ?Patient can return home with the following A little help with walking and/or transfers;A little help with bathing/dressing/bathroom;Assistance with cooking/housework;Assist for transportation;Help with stairs or ramp for entrance ?  ?Equipment Recommendations ? None recommended by PT  ?  ?Recommendations for Other Services   ? ? ?  ?Precautions / Restrictions Precautions ?Precautions: Fall ?Restrictions ?Weight Bearing Restrictions: Yes ?RLE Weight Bearing: Weight bearing as tolerated  ?  ? ?Mobility ? Bed Mobility ?Overal bed mobility: Needs Assistance ?Bed Mobility: Sit to Supine ?  ?  ?  ?Sit to supine: Min assist, Min guard ?  ?General bed mobility comments: cues for self assist with leg lifter, light assist to complete lifting RLE on to bed ?  ? ?Transfers ?Overall transfer level: Needs assistance ?Equipment used: Rolling walker (2 wheels) ?Transfers: Sit to/from Stand ?Sit to Stand: Min guard ?  ?  ?  ?  ?  ?General transfer comment: Pt min guard for safety only from elevated surface, no physical assist required; VCs for sequencing and hand placement to power up. ?  ? ?Ambulation/Gait ?Ambulation/Gait assistance: Min guard, Supervision ?Gait  Distance (Feet): 110 Feet ?Assistive device: Rolling walker (2 wheels) ?Gait Pattern/deviations: Step-to pattern ?  ?  ?  ?General Gait Details: cues for initial seqeunce and RW position. good stability with RW, no LOB ? ? ?Stairs ?  ?  ?  ?  ?  ? ? ?Wheelchair Mobility ?  ? ?Modified Rankin (Stroke Patients Only) ?  ? ? ?  ?Balance   ?  ?  ?  ?  ?  ?  ?  ?  ?  ?  ?  ?  ?  ?  ?  ?  ?  ?  ?  ? ?  ?Cognition Arousal/Alertness: Awake/alert ?Behavior During Therapy: [facility] for tasks assessed/performed ?Overall Cognitive Status: Within Functional Limits for tasks assessed ?  ?  ?  ?  ?  ?  ?  ?  ?  ?  ?  ?  ?  ?  ?  ?  ?  ?  ?  ? ?  ?Exercises Total Joint Exercises ?Ankle Circles/Pumps: AROM, 20 reps, Both ? ?  ?General Comments   ?  ?  ? ?Pertinent Vitals/Pain Pain Assessment ?Pain Assessment: 0-10 ?Pain Score: 4  ?Pain Location: r hip ?Pain Descriptors / Indicators: Operative site guarding, Discomfort ?Pain Intervention(s): Limited activity within patient's tolerance, Monitored during session, Premedicated before session, Repositioned, Patient requesting pain meds-RN notified, RN gave pain meds during session, Ice applied  ? ? ?Home Living   ?  ?  ?  ?  ?  ?  ?  ?  ?  ?   ?  ?Prior Function    ?  ?  ?   ? ?  PT Goals (current goals can now be found in the care plan section) Acute Rehab PT Goals ?Patient Stated Goal: Walking more comfortably, water aerobics, yoga ?PT Goal Formulation: With patient ?Time For Goal Achievement: 01/25/22 ?Potential to Achieve Goals: Good ?Progress towards PT goals: Progressing toward goals ? ?  ?Frequency ? ? ? 7X/week ? ? ? ?  ?PT Plan Current plan remains appropriate  ? ? ?Co-evaluation   ?  ?  ?  ?  ? ?  ?AM-PAC PT "6 Clicks" Mobility   ?Outcome Measure ? Help needed turning from your back to your side while in a flat bed without using bedrails?: None ?Help needed moving from lying on your back to sitting on the side of a flat bed without using bedrails?: A Little ?Help needed moving to and  from a bed to a chair (including a wheelchair)?: A Little ?Help needed standing up from a chair using your arms (e.g., wheelchair or bedside chair)?: A Little ?Help needed to walk in hospital room?: A Little ?Help needed climbing 3-5 steps with a railing? : A Little ?6 Click Score: 19 ? ?  ?End of Session Equipment Utilized During Treatment: Gait belt ?Activity Tolerance: Patient tolerated treatment well ?Patient left: in bed;with call bell/phone within reach;with bed alarm set ?Nurse Communication: Mobility status ?PT Visit Diagnosis: Difficulty in walking, not elsewhere classified (R26.2) ?  ? ? ?Time: 8413-2440 ?PT Time Calculation (min) (ACUTE ONLY): 18 min ? ?Charges:  $Gait Training: 8-22 mins          ?          ?Baxter Flattery, PT ? ?Acute Rehab Dept [facility]) [phone] ?Pager [phone] ? ?01/19/2022 ? ? ? ?Denisha Hoel ?01/19/2022, 11:53 AM ? ?

## 2022-01-19 NOTE — Plan of Care (Signed)
  Problem: Education: Goal: Knowledge of the prescribed therapeutic regimen will improve Outcome: Progressing   Problem: Activity: Goal: Ability to avoid complications of mobility impairment will improve Outcome: Progressing   Problem: Pain Management: Goal: Pain level will decrease with appropriate interventions Outcome: Progressing   

## 2022-01-21 ENCOUNTER — Non-Acute Institutional Stay (SKILLED_NURSING_FACILITY): Payer: Medicare Other | Admitting: Internal Medicine

## 2022-01-21 ENCOUNTER — Encounter: Payer: Self-pay | Admitting: Internal Medicine

## 2022-01-21 DIAGNOSIS — E785 Hyperlipidemia, unspecified: Secondary | ICD-10-CM | POA: Diagnosis not present

## 2022-01-21 DIAGNOSIS — D649 Anemia, unspecified: Secondary | ICD-10-CM | POA: Diagnosis not present

## 2022-01-21 DIAGNOSIS — F419 Anxiety disorder, unspecified: Secondary | ICD-10-CM

## 2022-01-21 DIAGNOSIS — Z96641 Presence of right artificial hip joint: Secondary | ICD-10-CM | POA: Diagnosis not present

## 2022-01-21 NOTE — Progress Notes (Signed)
?Provider:  Veleta Miners MD  ?Location:    [facility] ?Nursing Home Room Number: 338 ?Place of Service:  SNF (31) ? ?PCP: Lajean Manes, MD ?Patient Care Team: ?Lajean Manes, MD as PCP - General (Internal Medicine) ? ?Extended Emergency Contact Information ?Primary Emergency Contact: Marques,ROBERT ?Address: [address]         [place], [place] [address] [place] ?Home Phone: [phone] ?Mobile Phone: [phone] ?Relation: Spouse ? ?Code Status: Full Code ?Goals of Care: Advanced Directive information ? ?  01/21/2022  ?  9:48 AM  ?Advanced Directives  ?Does Patient Have a Medical Advance Directive? Yes  ?Type of Advance Directive Living will  ?Does patient want to make changes to medical advance directive? No - Patient declined  ? ? ? ? ?Chief Complaint  ?Patient presents with  ? New Admit To SNF  ?  Admission to SNF  ? ? ?HPI: Patient is a 76 y.o. female seen today for admission to rehab ?After undergoing right total hip arthroplasty on 05/05 ? ?Patient has a history of osteoporosis and hyperlipidemia and anxiety ? ?She was admitted electively to undergo right hip arthroplasty ?Postop was uneventful ?Patient's pain is controlled on oxycodone ?Her only complaint today was constipation.  She is working with therapy.  Able to walk with her walker. ?Lives with her husband in [place] and [place].  At baseline very active. ? ? ?Past Medical History:  ?Diagnosis Date  ? Anxiety   ? Arthritis   ? HDL deficiency   ? Pre-diabetes   ? Diet controlled  ? ?Past Surgical History:  ?Procedure Laterality Date  ? COLONOSCOPY    ? EYE SURGERY Bilateral 2018  ? cateract  ? SHOULDER ARTHROSCOPY Right 1997  ? TONSILLECTOMY    ? age 83  ? ? reports that she has never smoked. She has never used smokeless tobacco. She reports current alcohol use of about 14.0 standard drinks per week. She reports that she does not use drugs. ?Social History  ? ?Socioeconomic History  ? Marital status:  Married  ?  Spouse name: Not on file  ? Number of children: Not on file  ? Years of education: Not on file  ? Highest education level: Not on file  ?Occupational History  ? Not on file  ?Tobacco Use  ? Smoking status: Never  ? Smokeless tobacco: Never  ?Vaping Use  ? Vaping Use: Never used  ?Substance and Sexual Activity  ? Alcohol use: Yes  ?  Alcohol/week: 14.0 standard drinks  ?  Types: 14 Glasses of wine per week  ? Drug use: Never  ? Sexual activity: Not on file  ?Other Topics Concern  ? Not on file  ?Social History Narrative  ? Not on file  ? ?Social Determinants of Health  ? ?Financial Resource Strain: Not on file  ?Food Insecurity: Not on file  ?Transportation Needs: Not on file  ?Physical Activity: Not on file  ?Stress: Not on file  ?Social Connections: Not on file  ?Intimate Partner Violence: Not on file  ? ? ?Functional Status Survey: ?  ? ?History reviewed. No pertinent family history. ? ?Health Maintenance  ?Topic Date Due  ? Hepatitis C Screening  Never done  ? TETANUS/TDAP  Never done  ? Pneumonia Vaccine 15+ Years old (1 - PCV) Never done  ? DEXA SCAN  Never done  ? Zoster Vaccines- Shingrix (2 of 2) 06/29/2019  ? COVID-19 Vaccine (1) 07/23/2021  ? INFLUENZA VACCINE  04/16/2022  ? HPV VACCINES  Aged Out  ? ? ?No Known Allergies ? ?Allergies as of 01/21/2022   ?No Known Allergies ?  ? ?  ?Medication List  ?  ? ?  ? Accurate as of Jan 21, 2022  9:49 AM. If you have any questions, ask your nurse or doctor.  ?  ?  ? ?  ? ?Advil Dual Action 125-250 MG Tabs ?Generic drug: Ibuprofen-Acetaminophen ?Take 2 tablets by mouth daily as needed (pain). ?  ?alendronate 70 MG tablet ?Commonly known as: FOSAMAX ?Take 1 tablet by mouth weekly 30 minutes before the first food, beverage, or medicine of the day with plain water ?(1 tablet 30 minutes before the first food, beverage or medicine of the day with plain water Orally Once a week 30 day(s)) ?  ?aspirin 81 MG chewable tablet ?Chew 1 tablet (81 mg total) by mouth 2  (two) times daily. ?  ?CALCIUM PO ?Take 1,000 mg by mouth at bedtime. ?  ?docusate sodium 100 MG capsule ?Commonly known as: COLACE ?Take 100 mg by mouth 2 (two) times daily. ?  ?ezetimibe 10 MG tablet ?Commonly known as: ZETIA ?Take 10 mg by mouth every evening. ?  ?methocarbamol 500 MG tablet ?Commonly known as: ROBAXIN ?Take 1 tablet (500 mg total) by mouth every 6 (six) hours as needed for muscle spasms. ?  ?ondansetron 4 MG tablet ?Commonly known as: ZOFRAN ?Take 4 mg by mouth every 8 (eight) hours as needed for nausea or vomiting. ?  ?oxyCODONE 5 MG immediate release tablet ?Commonly known as: Oxy IR/ROXICODONE ?Take 1-2 tablets (5-10 mg total) by mouth every 4 (four) hours as needed for moderate pain (pain score 4-6). ?  ?PARoxetine 25 MG 24 hr tablet ?Commonly known as: PAXIL-CR ?Take 25 mg by mouth daily. ?  ?simvastatin 40 MG tablet ?Commonly known as: ZOCOR ?Take 40 mg by mouth every evening. ?  ?sodium chloride 0.65 % Soln nasal spray ?Commonly known as: OCEAN ?Place 1 spray into both nostrils as needed for congestion. ?  ?SYSTANE OP ?Place 1 drop into both eyes 2 (two) times daily. ?  ?Vitamin D 50 MCG (2000 UT) tablet ?Take 2,000 Units by mouth daily. ?  ? ?  ? ? ?Review of Systems  ?Constitutional:  Negative for activity change and appetite change.  ?HENT: Negative.    ?Respiratory:  Negative for cough and shortness of breath.   ?Cardiovascular:  Negative for leg swelling.  ?Gastrointestinal:  Positive for constipation.  ?Genitourinary: Negative.   ?Musculoskeletal:  Positive for arthralgias, gait problem and myalgias.  ?Skin: Negative.   ?Neurological:  Negative for dizziness and weakness.  ?Psychiatric/Behavioral:  Negative for confusion, dysphoric mood and sleep disturbance.   ? ?Vitals:  ? 01/21/22 0937  ?BP: (!) 106/56  ?Pulse: 66  ?Resp: 16  ?Temp: 98.6 ?F (37 ?C)  ?SpO2: 96%  ?Weight: 143 lb (64.9 kg)  ?Height: 5' 4.5" (1.638 m)  ? ?Body mass index is 24.17 kg/m?Marland Kitchen ?Physical Exam ?Vitals  reviewed.  ?Constitutional:   ?   Appearance: Normal appearance.  ?HENT:  ?   Head: Normocephalic.  ?   Nose: Nose normal.  ?   Mouth/Throat:  ?   Mouth: Mucous membranes are moist.  ?   Pharynx: Oropharynx is clear.  ?Eyes:  ?   Pupils: Pupils are equal, round, and reactive to light.  ?Cardiovascular:  ?   Rate and Rhythm: Normal rate and regular rhythm.  ?   Pulses: Normal pulses.  ?  Heart sounds: Normal heart sounds. No murmur heard. ?Pulmonary:  ?   Effort: Pulmonary effort is normal.  ?   Breath sounds: Normal breath sounds.  ?Abdominal:  ?   General: Abdomen is flat. Bowel sounds are normal.  ?   Palpations: Abdomen is soft.  ?Musculoskeletal:     ?   General: No swelling.  ?   Cervical back: Neck supple.  ?Skin: ?   General: Skin is warm.  ?Neurological:  ?   General: No focal deficit present.  ?   Mental Status: She is alert and oriented to person, place, and time.  ?   Comments: Mild weakness of Right leg  ?Psychiatric:     ?   Mood and Affect: Mood normal.     ?   Thought Content: Thought content normal.  ? ? ?Labs reviewed: ?Basic Metabolic Panel: ?Recent Labs  ?  01/09/22 ?0821  ?NA 141  ?K 4.7  ?CL 107  ?CO2 27  ?GLUCOSE 107*  ?BUN 18  ?CREATININE 0.84  ?CALCIUM 9.5  ? ?Liver Function Tests: ?No results for input(s): AST, ALT, ALKPHOS, BILITOT, PROT, ALBUMIN in the last 8760 hours. ?No results for input(s): LIPASE, AMYLASE in the last 8760 hours. ?No results for input(s): AMMONIA in the last 8760 hours. ?CBC: ?Recent Labs  ?  01/09/22 ?0821 01/19/22 ?0329  ?WBC 6.0 8.8  ?HGB 13.1 10.4*  ?HCT 41.0 32.3*  ?MCV 91.9 93.4  ?PLT 294 235  ? ?Cardiac Enzymes: ?No results for input(s): CKTOTAL, CKMB, CKMBINDEX, TROPONINI in the last 8760 hours. ?BNP: ?Invalid input(s): POCBNP ?Lab Results  ?Component Value Date  ? HGBA1C 6.3 (H) 01/09/2022  ? ?No results found for: TSH ?No results found for: VITAMINB12 ?No results found for: FOLATE ?No results found for: IRON, TIBC, FERRITIN ? ?Imaging and Procedures obtained  prior to SNF admission: ?DG Pelvis Portable ? ?Result Date: 01/18/2022 ?CLINICAL DATA:  Follow-up total hip replacement EXAM: PORTABLE PELVIS 1-2 VIEWS COMPARISON:  09/06/2021 FINDINGS: Total hip arthroplasty on th

## 2022-01-28 ENCOUNTER — Non-Acute Institutional Stay (SKILLED_NURSING_FACILITY): Payer: Medicare Other | Admitting: Adult Health

## 2022-01-28 ENCOUNTER — Encounter: Payer: Self-pay | Admitting: Adult Health

## 2022-01-28 DIAGNOSIS — M816 Localized osteoporosis [Lequesne]: Secondary | ICD-10-CM | POA: Diagnosis not present

## 2022-01-28 DIAGNOSIS — M1611 Unilateral primary osteoarthritis, right hip: Secondary | ICD-10-CM

## 2022-01-28 DIAGNOSIS — K5903 Drug induced constipation: Secondary | ICD-10-CM | POA: Diagnosis not present

## 2022-01-28 DIAGNOSIS — D62 Acute posthemorrhagic anemia: Secondary | ICD-10-CM

## 2022-01-28 DIAGNOSIS — Z96641 Presence of right artificial hip joint: Secondary | ICD-10-CM | POA: Diagnosis not present

## 2022-01-28 DIAGNOSIS — E782 Mixed hyperlipidemia: Secondary | ICD-10-CM

## 2022-01-28 NOTE — Progress Notes (Signed)
?Location:  [facility] ?  ?Place of Service:  SNF (31) ? ?Provider:  ?Cindi Carbon, ANP ?[place] ?[phone] ? ?PCP: Lajean Manes, MD ?Patient Care Team: ?Lajean Manes, MD as PCP - General (Internal Medicine) ? ?Extended Emergency Contact Information ?Primary Emergency Contact: Kawai,ROBERT ?Address: [address]         [place], [place] [address] [place] ?Home Phone: [phone] ?Mobile Phone: [phone] ?Relation: Spouse ? ?Code Status: Full ?Goals of care:  Advanced Directive information ? ?  01/21/2022  ?  9:48 AM  ?Advanced Directives  ?Does Patient Have a Medical Advance Directive? Yes  ?Type of Advance Directive Living will  ?Does patient want to make changes to medical advance directive? No - Patient declined  ? ? ? ?No Known Allergies ? ?Chief Complaint  ?Patient presents with  ? Discharge Note  ? ? ?HPI:  ?76 y.o. female seen for discharge from [facility] skilled rehab s/p right total hip replacement with anterior approach 01/18/22.   ? ?During her stay she experienced some constipation which has resolved.  ?She was prescribed advil duo action and has been using this with relief. She is no longer using oxycodone.  ?She has independent in ADls. Has been walking with therapy and doing well.  ?She still has her postop dressing and is seeing ortho this week.  ?No sob, calf swelling, redness etc.  ? ? ? ? ?Past Medical History:  ?Diagnosis Date  ? Anxiety   ? Arthritis   ? HDL deficiency   ? Pre-diabetes   ? Diet controlled  ? ? ?Past Surgical History:  ?Procedure Laterality Date  ? COLONOSCOPY    ? EYE SURGERY Bilateral 2018  ? cateract  ? SHOULDER ARTHROSCOPY Right 1997  ? TONSILLECTOMY    ? age 72  ? TOTAL HIP ARTHROPLASTY Right 01/18/2022  ? Procedure: RIGHT TOTAL HIP ARTHROPLASTY ANTERIOR APPROACH;  Surgeon: Mcarthur Rossetti, MD;  Location: [facility];  Service: Orthopedics;  Laterality: Right;  ? ? ?  reports that she has never smoked.  She has never used smokeless tobacco. She reports current alcohol use of about 14.0 standard drinks per week. She reports that she does not use drugs. ?Social History  ? ?Socioeconomic History  ? Marital status: Married  ?  Spouse name: Not on file  ? Number of children: Not on file  ? Years of education: Not on file  ? Highest education level: Not on file  ?Occupational History  ? Not on file  ?Tobacco Use  ? Smoking status: Never  ? Smokeless tobacco: Never  ?Vaping Use  ? Vaping Use: Never used  ?Substance and Sexual Activity  ? Alcohol use: Yes  ?  Alcohol/week: 14.0 standard drinks  ?  Types: 14 Glasses of wine per week  ? Drug use: Never  ? Sexual activity: Not on file  ?Other Topics Concern  ? Not on file  ?Social History Narrative  ? Not on file  ? ?Social Determinants of Health  ? ?Financial Resource Strain: Not on file  ?Food Insecurity: Not on file  ?Transportation Needs: Not on file  ?Physical Activity: Not on file  ?Stress: Not on file  ?Social Connections: Not on file  ?Intimate Partner Violence: Not on file  ? ?Functional Status Survey: ?  ? ?No Known Allergies ? ?Pertinent  Health Maintenance Due  ?Topic Date Due  ? URINE MICROALBUMIN  Never done  ? DEXA SCAN  Never done  ?  INFLUENZA VACCINE  04/16/2022  ? ? ?Medications: ?Outpatient Encounter Medications as of 01/28/2022  ?Medication Sig  ? alendronate (FOSAMAX) 70 MG tablet 1 tablet 30 minutes before the first food, beverage or medicine of the day with plain water Orally Once a week 30 day(s)  ? aspirin 81 MG chewable tablet Chew 1 tablet (81 mg total) by mouth 2 (two) times daily.  ? CALCIUM PO Take 1,000 mg by mouth at bedtime.  ? Cholecalciferol (VITAMIN D) 50 MCG (2000 UT) tablet Take 2,000 Units by mouth daily.  ? docusate sodium (COLACE) 100 MG capsule Take 100 mg by mouth 2 (two) times daily.  ? ezetimibe (ZETIA) 10 MG tablet Take 10 mg by mouth every evening.  ? Ibuprofen-Acetaminophen (ADVIL DUAL ACTION) 125-250 MG TABS Take 2 tablets by  mouth daily as needed (pain).  ? methocarbamol (ROBAXIN) 500 MG tablet Take 1 tablet (500 mg total) by mouth every 6 (six) hours as needed for muscle spasms.  ? ondansetron (ZOFRAN) 4 MG tablet Take 4 mg by mouth every 8 (eight) hours as needed for nausea or vomiting.  ? oxyCODONE (OXY IR/ROXICODONE) 5 MG immediate release tablet Take 1-2 tablets (5-10 mg total) by mouth every 4 (four) hours as needed for moderate pain (pain score 4-6).  ? PARoxetine (PAXIL-CR) 25 MG 24 hr tablet Take 25 mg by mouth daily.  ? Polyethyl Glycol-Propyl Glycol (SYSTANE OP) Place 1 drop into both eyes 2 (two) times daily.  ? simvastatin (ZOCOR) 40 MG tablet Take 40 mg by mouth every evening.  ? sodium chloride (OCEAN) 0.65 % SOLN nasal spray Place 1 spray into both nostrils as needed for congestion.  ? ?No facility-administered encounter medications on file as of 01/28/2022.  ? ? ?Review of Systems  ?Constitutional:  Negative for activity change, appetite change, chills, diaphoresis, fatigue, fever and unexpected weight change.  ?HENT:  Negative for congestion.   ?Respiratory:  Negative for cough, shortness of breath and wheezing.   ?Cardiovascular:  Positive for leg swelling (slight on right thigh). Negative for chest pain and palpitations.  ?Gastrointestinal:  Negative for abdominal distention, abdominal pain, constipation and diarrhea.  ?Genitourinary:  Negative for difficulty urinating and dysuria.  ?Musculoskeletal:  Positive for gait problem. Negative for arthralgias, back pain, joint swelling and myalgias.  ?Neurological:  Negative for dizziness, tremors, seizures, syncope, facial asymmetry, speech difficulty, weakness, light-headedness, numbness and headaches.  ?Psychiatric/Behavioral:  Negative for agitation, behavioral problems and confusion.   ? ?Vitals:  ? 01/28/22 1216  ?BP: 124/61  ?Pulse: (!) 59  ?Resp: 17  ?Temp: 98.1 ?F (36.7 ?C)  ?SpO2: 98%  ? ?There is no height or weight on file to calculate BMI. ?Physical Exam ?Vitals  and nursing note reviewed.  ?Constitutional:   ?   General: She is not in acute distress. ?   Appearance: She is not diaphoretic.  ?HENT:  ?   Head: Normocephalic and atraumatic.  ?Neck:  ?   Vascular: No JVD.  ?Cardiovascular:  ?   Rate and Rhythm: Normal rate and regular rhythm.  ?   Heart sounds: No murmur heard. ?Pulmonary:  ?   Effort: Pulmonary effort is normal. No respiratory distress.  ?   Breath sounds: Normal breath sounds. No wheezing.  ?Abdominal:  ?   General: Bowel sounds are normal. There is no distension.  ?   Palpations: Abdomen is soft.  ?Musculoskeletal:  ?   Right lower leg: Edema (not pitting. Noted to left thigh and lower leg. No warmth,  no tenderness, no redness) present.  ?   Left lower leg: No edema.  ?   Comments: +CMS to RLE  ?Skin: ?   General: Skin is warm and dry.  ?   Comments: Right hip dressing CDI no surrounding erythema or bruising.   ?Neurological:  ?   Mental Status: She is alert and oriented to person, place, and time.  ?Psychiatric:     ?   Mood and Affect: Mood normal.  ? ? ?Labs reviewed: ?Basic Metabolic Panel: ?Recent Labs  ?  01/09/22 ?0821  ?NA 141  ?K 4.7  ?CL 107  ?CO2 27  ?GLUCOSE 107*  ?BUN 18  ?CREATININE 0.84  ?CALCIUM 9.5  ? ?Liver Function Tests: ?No results for input(s): AST, ALT, ALKPHOS, BILITOT, PROT, ALBUMIN in the last 8760 hours. ?No results for input(s): LIPASE, AMYLASE in the last 8760 hours. ?No results for input(s): AMMONIA in the last 8760 hours. ?CBC: ?Recent Labs  ?  01/09/22 ?0821 01/19/22 ?0329  ?WBC 6.0 8.8  ?HGB 13.1 10.4*  ?HCT 41.0 32.3*  ?MCV 91.9 93.4  ?PLT 294 235  ? ?Cardiac Enzymes: ?No results for input(s): CKTOTAL, CKMB, CKMBINDEX, TROPONINI in the last 8760 hours. ?BNP: ?Invalid input(s): POCBNP ?CBG: ?Recent Labs  ?  01/09/22 ?0801  ?GLUCAP 97  ? ? ?Procedures and Imaging Studies During Stay: ?DG Pelvis Portable ? ?Result Date: 01/18/2022 ?CLINICAL DATA:  Follow-up total hip replacement EXAM: PORTABLE PELVIS 1-2 VIEWS COMPARISON:   09/06/2021 FINDINGS: Total hip arthroplasty on the right. Components appear well positioned. No radiographically detectable complication. No other regional abnormality. IMPRESSION: Good appearance following

## 2022-01-31 ENCOUNTER — Other Ambulatory Visit (HOSPITAL_BASED_OUTPATIENT_CLINIC_OR_DEPARTMENT_OTHER): Payer: Self-pay

## 2022-01-31 ENCOUNTER — Encounter: Payer: Self-pay | Admitting: Orthopaedic Surgery

## 2022-01-31 ENCOUNTER — Ambulatory Visit (INDEPENDENT_AMBULATORY_CARE_PROVIDER_SITE_OTHER): Payer: Medicare Other | Admitting: Orthopaedic Surgery

## 2022-01-31 DIAGNOSIS — Z96641 Presence of right artificial hip joint: Secondary | ICD-10-CM

## 2022-01-31 NOTE — Progress Notes (Signed)
The patient is at her first postoperative visit status post a right total hip arthroplasty.  She is doing great overall and only taken Advil.  She has been compliant with a baby aspirin twice daily.  She has been wearing compressive socks.  She denies any foot and ankle swelling or calf pain.  Her right hip incision looks good.  The staples were removed and Steri-Strips applied.  She has good mobility overall.  She can go down to a baby aspirin once a day for a week and then can stop the baby aspirin.  She can drop from my standpoint.  I will see her back in 4 weeks to see how she is doing overall.  She will wait to get back in the pool to around June 4 or 5.

## 2022-02-14 ENCOUNTER — Encounter: Payer: Medicare Other | Admitting: Orthopaedic Surgery

## 2022-02-27 ENCOUNTER — Other Ambulatory Visit (HOSPITAL_BASED_OUTPATIENT_CLINIC_OR_DEPARTMENT_OTHER): Payer: Self-pay

## 2022-02-27 MED ORDER — PREDNISOLONE ACETATE 1 % OP SUSP
OPHTHALMIC | 1 refills | Status: DC
  Filled 2022-02-27: qty 5, 25d supply, fill #0
  Filled 2022-03-04: qty 5, 25d supply, fill #1

## 2022-02-28 ENCOUNTER — Ambulatory Visit (INDEPENDENT_AMBULATORY_CARE_PROVIDER_SITE_OTHER): Payer: Medicare Other | Admitting: Orthopaedic Surgery

## 2022-02-28 ENCOUNTER — Encounter: Payer: Self-pay | Admitting: Orthopaedic Surgery

## 2022-02-28 DIAGNOSIS — Z96641 Presence of right artificial hip joint: Secondary | ICD-10-CM

## 2022-02-28 NOTE — Progress Notes (Signed)
The patient is now 6 weeks status post a right total hip arthroplasty.  She is very active and young appearing 76 year old female.  She is driven some.  She reports good range of motion and strength.  She is still has some start up pain when she first gets up but is able to walk it off.  Her right operative hip moves smoothly and fluidly without any issues at all.  From my standpoint she will continue increase her activities with no restrictions.  I will see her back in 6 months for standing AP pelvis lateral of her right hip.  If there are any issues before then they know to let us know.

## 2022-03-04 ENCOUNTER — Other Ambulatory Visit (HOSPITAL_BASED_OUTPATIENT_CLINIC_OR_DEPARTMENT_OTHER): Payer: Self-pay

## 2022-03-04 MED ORDER — PREDNISOLONE ACETATE 1 % OP SUSP
OPHTHALMIC | 1 refills | Status: DC
  Filled 2022-03-04: qty 5, 16d supply, fill #0
  Filled 2022-03-20: qty 15, 50d supply, fill #0

## 2022-03-18 ENCOUNTER — Other Ambulatory Visit (HOSPITAL_BASED_OUTPATIENT_CLINIC_OR_DEPARTMENT_OTHER): Payer: Self-pay

## 2022-03-20 ENCOUNTER — Other Ambulatory Visit (HOSPITAL_BASED_OUTPATIENT_CLINIC_OR_DEPARTMENT_OTHER): Payer: Self-pay

## 2022-04-15 DIAGNOSIS — M25561 Pain in right knee: Secondary | ICD-10-CM | POA: Insufficient documentation

## 2022-04-23 ENCOUNTER — Ambulatory Visit: Payer: Medicare Other | Admitting: Sports Medicine

## 2022-04-23 ENCOUNTER — Other Ambulatory Visit (HOSPITAL_BASED_OUTPATIENT_CLINIC_OR_DEPARTMENT_OTHER): Payer: Self-pay

## 2022-04-23 VITALS — BP 146/57 | Ht 64.5 in | Wt 145.0 lb

## 2022-04-23 DIAGNOSIS — M25561 Pain in right knee: Secondary | ICD-10-CM

## 2022-04-23 MED ORDER — MELOXICAM 15 MG PO TABS
ORAL_TABLET | ORAL | 0 refills | Status: DC
  Filled 2022-04-23: qty 40, 40d supply, fill #0

## 2022-04-23 MED ORDER — METHYLPREDNISOLONE ACETATE 40 MG/ML IJ SUSP
40.0000 mg | Freq: Once | INTRAMUSCULAR | Status: AC
  Administered 2022-04-23: 40 mg via INTRA_ARTICULAR

## 2022-04-23 NOTE — Progress Notes (Unsigned)
PCP: Lajean Manes, MD  Subjective:   HPI: Patient is a 76 y.o. female here for right knee pain.  History of chronic mild right knee pain that has worsened significantly in the last 3 weeks. Her pain is located on the anterior medial side of her knee. It is painful with walking, especially downhill and down stairs. She has been icing her knee which has helped. She has also been taking meloxicam which helps. She had a right hip replacement in May and has been doing her physical therapy exercises which she thinks may be aggravating her knee pain.   Past Medical History:  Diagnosis Date   Anxiety    Arthritis    HDL deficiency    Pre-diabetes    Diet controlled    Current Outpatient Medications on File Prior to Visit  Medication Sig Dispense Refill   alendronate (FOSAMAX) 70 MG tablet 1 tablet 30 minutes before the first food, beverage or medicine of the day with plain water Orally Once a week 30 day(s) 4 tablet 11   aspirin 81 MG chewable tablet Chew 1 tablet (81 mg total) by mouth 2 (two) times daily. 30 tablet 0   CALCIUM PO Take 1,000 mg by mouth at bedtime.     Cholecalciferol (VITAMIN D) 50 MCG (2000 UT) tablet Take 2,000 Units by mouth daily.     docusate sodium (COLACE) 100 MG capsule Take 100 mg by mouth 2 (two) times daily.     ezetimibe (ZETIA) 10 MG tablet Take 10 mg by mouth every evening.     Ibuprofen-Acetaminophen (ADVIL DUAL ACTION) 125-250 MG TABS Take 2 tablets by mouth daily as needed (pain).     methocarbamol (ROBAXIN) 500 MG tablet Take 1 tablet (500 mg total) by mouth every 6 (six) hours as needed for muscle spasms. 30 tablet 0   ondansetron (ZOFRAN) 4 MG tablet Take 4 mg by mouth every 8 (eight) hours as needed for nausea or vomiting.     PARoxetine (PAXIL-CR) 25 MG 24 hr tablet Take 25 mg by mouth daily.     Polyethyl Glycol-Propyl Glycol (SYSTANE OP) Place 1 drop into both eyes 2 (two) times daily.     prednisoLONE acetate (PRED FORTE) 1 % ophthalmic suspension  Place 1 drop in the right eye 6 times daily 15 mL 1   simvastatin (ZOCOR) 40 MG tablet Take 40 mg by mouth every evening.     sodium chloride (OCEAN) 0.65 % SOLN nasal spray Place 1 spray into both nostrils as needed for congestion.     No current facility-administered medications on file prior to visit.    Past Surgical History:  Procedure Laterality Date   COLONOSCOPY     EYE SURGERY Bilateral 2018   cateract   SHOULDER ARTHROSCOPY Right 1997   TONSILLECTOMY     age 18   TOTAL HIP ARTHROPLASTY Right 01/18/2022   Procedure: RIGHT TOTAL HIP ARTHROPLASTY ANTERIOR APPROACH;  Surgeon: Mcarthur Rossetti, MD;  Location: [facility];  Service: Orthopedics;  Laterality: Right;    No Known Allergies  BP (!) 146/57   Ht 5' 4.5" (1.638 m)   Wt 145 lb (65.8 kg)   BMI 24.50 kg/m      05/29/2021   11:33 AM 06/26/2021   11:21 AM 07/24/2021    1:29 PM 08/21/2021    9:02 AM  [place] Adult Exercise  Frequency of aerobic exercise (# of days/week) '3 6 6 6  '$ Average time in minutes 40 60 60  60  Frequency of strengthening activities (# of days/week) '3 5 5 5        '$ No data to display              Objective:  Physical Exam:  Gen: NAD, comfortable in exam room Knee, right: Inspection was negative for erythema, ecchymosis, and effusion. No obvious bony abnormalities or signs of osteophyte development. Palpation yielded no asymmetric warmth; Medial joint line tenderness; No condyle tenderness; No patellar tenderness; No knee crepitus. Patellar and quadriceps tendons unremarkable, and tenderness of the pes anserine bursa. Marland Kitchen ROM normal in flexion (135 degrees) and extension (0 degrees). Strength 5/5 with knee flexion and extension.   Provocative Testing:    - Patella:   - Patellar grind/compression: NEG   - Patellar glide: Appropriate medial/lateral glide without apprehension - Cruciate Ligaments:   - Anterior Drawer/Lachman test: NEG - Posterior Drawer: NEG  - Collateral  Ligaments:   - Varus/Valgus (MCL/LCL) Stress test at 0, 15d: NEG  - Meniscus:   - Thessaly: NEG   - McMurray's: NEG    Assessment & Plan:  1. Right knee pain. Tenderness over the medial joint line and pes anserine bursa concerning for arthritis vs per anserine bursitis. Injected the right knee joint today. Discussed ice massage and reducing postoperative hip exercises to treat possible bursitis.  I have also recommended that she try a compression sleeve.  If no improvement with knee joint injection and symptoms are likely secondary to pes anserine bursitis.  Follow-up in 2 weeks for reevaluation if still symptomatic.  If symptoms are still severe, follow-up with me again in 1 week.  After informed written consent timeout was performed, patient was seated on exam table. The patient's right knee was prepped with alcohol swabs and utilizing inferomedial approach, the patient's knee was injected intraarticularly with 3:1 lidocaine: depomedrol.  Shortly after the injection, Nichole [name] began to complain of some flushing and nausea followed by vomiting.  She remained in the office for observation for approximately 60 minutes.  Vitals remained stable and she eventually felt well enough to travel home with her husband driving.  Emergency contact number was provided in case symptoms worsened overnight and I will follow-up with [facility] via telephone tomorrow morning to see how she is feeling.  Consent obtained and verified. Time-out conducted. Noted no overlying erythema, induration, or other signs of local infection. Skin prepped in a sterile fashion. Topical analgesic spray: Ethyl chloride. Joint: Right knee Needle: 25-gauge 1.5 inch Completed without difficulty. Meds: 3 cc 1% Xylocaine, 1 cc (40 mg) Depo-Medrol  See above for details regarding Nichole [name]'s adverse reaction to today's cortisone injection.   Addendum: April 24, 2022 I spoke with [facility] on the phone this morning.  She is feeling much better.  We  will put a note in the chart stating that she has a reaction to cortisone injections which include flushing and vomiting.  This note was dictated using Dragon naturally speaking software and may contain errors in syntax, spelling, or content which have not been identified prior to signing this note.

## 2022-04-24 ENCOUNTER — Encounter: Payer: Self-pay | Admitting: *Deleted

## 2022-04-25 DIAGNOSIS — M1711 Unilateral primary osteoarthritis, right knee: Secondary | ICD-10-CM | POA: Insufficient documentation

## 2022-04-29 ENCOUNTER — Other Ambulatory Visit (HOSPITAL_BASED_OUTPATIENT_CLINIC_OR_DEPARTMENT_OTHER): Payer: Self-pay

## 2022-05-07 ENCOUNTER — Ambulatory Visit
Admission: RE | Admit: 2022-05-07 | Discharge: 2022-05-07 | Disposition: A | Discharge status: Home / Self Care | Payer: Medicare Other | Source: Ambulatory Visit | Attending: Sports Medicine | Admitting: Sports Medicine

## 2022-05-07 ENCOUNTER — Ambulatory Visit: Payer: Medicare Other | Admitting: Sports Medicine

## 2022-05-07 VITALS — BP 139/65 | Ht 64.5 in

## 2022-05-07 DIAGNOSIS — M7050 Other bursitis of knee, unspecified knee: Secondary | ICD-10-CM | POA: Diagnosis not present

## 2022-05-07 DIAGNOSIS — M25561 Pain in right knee: Secondary | ICD-10-CM | POA: Diagnosis not present

## 2022-05-08 NOTE — Progress Notes (Signed)
   Subjective:    Patient ID: Nichole [name], female    DOB: [birth year], 76 y.o.   MRN: [id number]  HPI  Nichole [name] presents today for follow-up on her right knee pain.  Her pain is improving but not resolved.  She still localizes her pain to the proximal medial tibia.  The cortisone injection administered at her last visit did help with her generalized arthritis pain she still having some discomfort in the area of the pes anserine bursa.  She is able to ambulate much easier however.  As documented in her previous note, she had an adverse reaction to the steroid injection including nausea and vomiting while in the office.  She was monitored until she felt well enough to go home and she was feeling much better the following day.    Review of Systems As above    Objective:   Physical Exam  Developed, well nourished.  No acute distress  Right knee full range of motion.  No effusion.  There is still some slight swelling and tenderness to palpation over the proximal medial tibia in the area of the pes anserine bursa.  Knee remained stable to ligamentous exam.  Neurovascular intact distally.      Assessment & Plan:   Right knee pain likely secondary to pes anserine bursitis  Given her adverse reaction to her previous cortisone injection, we are not going to inject her pes anserine bursa today.  I will instead refer her for some limited physical therapy.  I think she could benefit from a single session for some home exercises.  She has been icing her knee and I have encouraged her to continue with this.  I would look for symptoms to continue to improve but if not then I would consider further diagnostic imaging.  We will follow-up with her with x-ray results when available she will follow-up in the office as needed.  This note was dictated using Dragon naturally speaking software and may contain errors in syntax, spelling, or content which have not been identified prior to signing this note.

## 2022-05-09 ENCOUNTER — Encounter: Payer: Self-pay | Admitting: *Deleted

## 2022-05-15 ENCOUNTER — Other Ambulatory Visit: Payer: Self-pay

## 2022-05-15 ENCOUNTER — Ambulatory Visit: Payer: Medicare Other | Attending: Sports Medicine | Admitting: Rehabilitative and Restorative Service Providers"

## 2022-05-15 ENCOUNTER — Encounter: Payer: Self-pay | Admitting: Rehabilitative and Restorative Service Providers"

## 2022-05-15 DIAGNOSIS — M25561 Pain in right knee: Secondary | ICD-10-CM | POA: Diagnosis not present

## 2022-05-15 DIAGNOSIS — R2689 Other abnormalities of gait and mobility: Secondary | ICD-10-CM

## 2022-05-15 DIAGNOSIS — M6281 Muscle weakness (generalized): Secondary | ICD-10-CM

## 2022-05-15 NOTE — Therapy (Signed)
OUTPATIENT PHYSICAL THERAPY LOWER EXTREMITY EVALUATION   Patient Name: Nichole [name] MRN: [id number] DOB:April 01, 1946, 76 y.o., female Today's Date: 05/15/2022   PT End of Session - 05/15/22 0929     Visit Number 1    Date for PT Re-Evaluation 06/14/22    Authorization Type [employer] Medicare    Progress Note Due on Visit 10    PT Start Time 0921    PT Stop Time 1000    PT Time Calculation (min) 39 min    Activity Tolerance Patient tolerated treatment well    Behavior During Therapy [facility] for tasks assessed/performed             Past Medical History:  Diagnosis Date   Anxiety    Arthritis    HDL deficiency    Pre-diabetes    Diet controlled   Past Surgical History:  Procedure Laterality Date   COLONOSCOPY     EYE SURGERY Bilateral 2018   cateract   SHOULDER ARTHROSCOPY Right 1997   TONSILLECTOMY     age 34   TOTAL HIP ARTHROPLASTY Right 01/18/2022   Procedure: RIGHT TOTAL HIP ARTHROPLASTY ANTERIOR APPROACH;  Surgeon: Mcarthur Rossetti, MD;  Location: [facility];  Service: Orthopedics;  Laterality: Right;   Patient Active Problem List   Diagnosis Date Noted   Status post total replacement of right hip 01/18/2022   Unilateral primary osteoarthritis, right hip 12/24/2021    PCP: Lajean Manes, MD  REFERRING PROVIDER: Thurman Coyer, DO   REFERRING DIAG: [phone] (ICD-10-CM) - Right knee pain, unspecified chronicity   THERAPY DIAG:  Other abnormalities of gait and mobility  Muscle weakness (generalized)  Rationale for Evaluation and Treatment Rehabilitation  ONSET DATE: July 2023  SUBJECTIVE:   SUBJECTIVE STATEMENT: Pt reports that she has been doing HEP since her R THA in May 2023.  In July, she started noting some increased right knee pain that has gotten worse.  Pt diagnosed with knee bursitis from Dr Micheline Chapman and not a candidate for steroid injection on 05/07/22 visit secondary to poor reaction in the past.  PERTINENT HISTORY: S/p R THA on 01/18/22,  pre-diabetes, OA  PAIN:  Are you having pain? Yes: NPRS scale: 0-6/10 Pain location: right knee Pain description: intermittent, sore Aggravating factors: walking Relieving factors: ice and compression  PRECAUTIONS: None  WEIGHT BEARING RESTRICTIONS No  FALLS:  Has patient fallen in last 6 months? No  LIVING ENVIRONMENT: Lives with: lives with their spouse Lives in: House/apartment Independent living at [facility] Stairs: No Has following equipment at home: Single point cane, shower chair, and Grab bars  OCCUPATION: Retired  PLOF: Independent and Leisure: water aerobics, yoga, walking, reading, volunteer work  PATIENT GOALS:  To be able to walk 2 miles again without increased pain   OBJECTIVE:   DIAGNOSTIC FINDINGS:  R knee radiograph on 05/07/22:   No recent fracture is seen. Degenerative changes are noted in medial and patellofemoral compartments  PATIENT SURVEYS:  LEFS  54 / 80 = 67.5 %R  COGNITION:  Overall cognitive status: Within functional limits for tasks assessed     SENSATION: WFL  EDEMA:  Circumferential: R knee 37.5 cm, L knee 36.5 cm  MUSCLE LENGTH: Right hamstring tightness noted (pt with history of 2 hamstring tears)  POSTURE: No Significant postural limitations  PALPATION: Tender to palpation along right knee with edema noted  LOWER EXTREMITY ROM: WFL  LOWER EXTREMITY MMT: Right hip strength 4/5, Right hamstring strength of 4/5, R quad strength of  5/5.  LLE strength of 5/5.  LOWER EXTREMITY SPECIAL TESTS:  Knee special tests: Anterior drawer test: negative and Posterior drawer test: negative  FUNCTIONAL TESTS:  5 times sit to stand: 10.8 sec without UE assist Timed up and go (TUG): 7.1 sec  GAIT: Distance walked: >500 ft  Assistive device utilized: None Level of assistance: Complete Independence Comments: Pt with antalgic gait as distance increases    TODAY'S TREATMENT: 05/15/22:   Seated LAQ with hip adduction ball squeeze  2x10 RLE Standing:  marching, hip abduction, hip extension.  BLE x10 each Seated hamstring stretch x30 sec RLE   PATIENT EDUCATION:  Education details: Issued HEP Person educated: Patient Education method: Explanation, Demonstration, and Handouts Education comprehension: verbalized understanding and returned demonstration   HOME EXERCISE PROGRAM: Access Code: [email or web address] URL: [email or web address] Date: 05/15/2022 Prepared by: Shelby Dubin Jennifr Gaeta  Exercises - Seated Hamstring Stretch  - 1 x daily - 7 x weekly - 1 sets - 2 reps - 20 sec hold - Seated Long Arc Quad with Hip Adduction  - 1 x daily - 7 x weekly - 2 sets - 10 reps - Standing March with Counter Support  - 1 x daily - 7 x weekly - 2 sets - 10 reps - Mini Squat with Counter Support  - 1 x daily - 7 x weekly - 2 sets - 10 reps - Standing Hip Abduction with Counter Support  - 1 x daily - 7 x weekly - 2 sets - 10 reps - Standing Hip Extension with Counter Support  - 1 x daily - 7 x weekly - 2 sets - 10 reps - Supine Active Straight Leg Raise  - 1 x daily - 7 x weekly - 2 sets - 10 reps - Sidelying Hip Abduction  - 1 x daily - 7 x weekly - 2 sets - 10 reps - Supine Bridge  - 1 x daily - 7 x weekly - 2 sets - 10 reps  ASSESSMENT:  CLINICAL IMPRESSION: Patient is a 76 y.o. female who was seen today for physical therapy evaluation and treatment for right knee pain. Pt recently underwent a R THA on 01/18/22 and states that she may have had some flare up from her knees as a result of some of her home exercise program from her surgery.  Pts PLOF is able to walk a 2 mile path and walk her collie dog without increased pain.  Pt presents with muscle weakness, difficulty walking, some hamstring tightness noted, and some difficulty with car transfers.  Pt would benefit from skilled PT to progress towards goal related activities.   OBJECTIVE IMPAIRMENTS decreased balance, difficulty walking, decreased strength, impaired flexibility,  and pain.   ACTIVITY LIMITATIONS squatting  PARTICIPATION LIMITATIONS: community activity  PERSONAL FACTORS Time since onset of injury/illness/exacerbation and 3+ comorbidities: R THA on 01/18/22, OA, pre-diabetes  are also affecting patient's functional outcome.   REHAB POTENTIAL: Good  CLINICAL DECISION MAKING: Evolving/moderate complexity  EVALUATION COMPLEXITY: Moderate   GOALS: Goals reviewed with patient? Yes  SHORT TERM GOALS: Target date: 06/05/2022  Pt will be independent with initial HEP. Baseline: Goal status: INITIAL  2.  Pt to report at least a 40% improvement in symptoms. Baseline:  Goal status: INITIAL   LONG TERM GOALS: Target date: 07/05/2022   Pt will be independent with advanced HEP. Baseline:  Goal status: INITIAL  2.  Pt will increase LEFS to at least 75% to demonstrate her ability to perform functional tasks. Baseline: 67.5% Goal status:  INITIAL  3.  Pt to increase right hip and hamstring strength to at least 4+ to 5-/5 to allow her to easily perform car transfers and stairs with reciprocal pattern. Baseline: 4/5 Goal status: INITIAL  4.  Pt to report ability to walk her 2 mile path and walk her dog without increase in pain. Baseline:  Goal status: INITIAL   PLAN: PT FREQUENCY: 1x/week  PT DURATION: 8 weeks  PLANNED INTERVENTIONS: Therapeutic exercises, Therapeutic activity, Neuromuscular re-education, Balance training, Gait training, Patient/Family education, Self Care, Joint mobilization, Joint manipulation, Stair training, Aquatic Therapy, Dry Needling, Electrical stimulation, Cryotherapy, Moist heat, Taping, Vasopneumatic device, Ultrasound, Ionotophoresis '4mg'$ /ml Dexamethasone, Manual therapy, and Re-evaluation  PLAN FOR NEXT SESSION: assess and progress HEP, right hip strengthening, balance   Juel Burrow, PT, DPT 05/15/2022, 10:08 AM   [facility] [address] [place], [place] [address] Phone  # [phone] Fax [phone]

## 2022-05-24 ENCOUNTER — Encounter: Payer: Self-pay | Admitting: Physical Therapy

## 2022-05-24 ENCOUNTER — Ambulatory Visit: Payer: Medicare Other | Attending: Sports Medicine | Admitting: Physical Therapy

## 2022-05-24 DIAGNOSIS — M6281 Muscle weakness (generalized): Secondary | ICD-10-CM | POA: Diagnosis present

## 2022-05-24 DIAGNOSIS — M25551 Pain in right hip: Secondary | ICD-10-CM | POA: Insufficient documentation

## 2022-05-24 DIAGNOSIS — R2689 Other abnormalities of gait and mobility: Secondary | ICD-10-CM | POA: Diagnosis present

## 2022-05-24 NOTE — Therapy (Signed)
OUTPATIENT PHYSICAL THERAPY TREATMENT NOTE   Patient Name: Nichole [name] MRN: [id number] DOB:08/11/1946, 76 y.o., female Today's Date: 05/24/2022  PCP: Lajean Manes, MD REFERRING PROVIDER: Thurman Coyer, DO   END OF SESSION:   PT End of Session - 05/24/22 0808     Visit Number 2    Date for PT Re-Evaluation 07/05/22    Authorization Type [employer] Medicare    Progress Note Due on Visit 10    PT Start Time 0800    PT Stop Time 0845    PT Time Calculation (min) 45 min    Activity Tolerance Patient tolerated treatment well    Behavior During Therapy [facility] for tasks assessed/performed             Past Medical History:  Diagnosis Date   Anxiety    Arthritis    HDL deficiency    Pre-diabetes    Diet controlled   Past Surgical History:  Procedure Laterality Date   COLONOSCOPY     EYE SURGERY Bilateral 2018   cateract   SHOULDER ARTHROSCOPY Right 1997   TONSILLECTOMY     age 5   TOTAL HIP ARTHROPLASTY Right 01/18/2022   Procedure: RIGHT TOTAL HIP ARTHROPLASTY ANTERIOR APPROACH;  Surgeon: Mcarthur Rossetti, MD;  Location: [facility];  Service: Orthopedics;  Laterality: Right;   Patient Active Problem List   Diagnosis Date Noted   Status post total replacement of right hip 01/18/2022   Unilateral primary osteoarthritis, right hip 12/24/2021    REFERRING DIAG: M25.561 (ICD-10-CM) - Right knee pain, unspecified chronicity    THERAPY DIAG:  Other abnormalities of gait and mobility  Muscle weakness (generalized)  Pain in right hip  Rationale for Evaluation and Treatment Rehabilitation  PERTINENT HISTORY: S/p R THA on 01/18/22, pre-diabetes, OA    PRECAUTIONS: None  SUBJECTIVE: Doing my homework  PAIN:  Are you having pain? No   OBJECTIVE: (objective measures completed at initial evaluation unless otherwise dated)  DIAGNOSTIC FINDINGS:  R knee radiograph on 05/07/22:   No recent fracture is seen. Degenerative changes are noted in medial and patellofemoral  compartments   PATIENT SURVEYS:  LEFS  54 / 80 = 67.5 %R   COGNITION:           Overall cognitive status: Within functional limits for tasks assessed                          SENSATION: WFL   EDEMA:  Circumferential: R knee 37.5 cm, L knee 36.5 cm   MUSCLE LENGTH: Right hamstring tightness noted (pt with history of 2 hamstring tears)   POSTURE: No Significant postural limitations   PALPATION: Tender to palpation along right knee with edema noted   LOWER EXTREMITY ROM: WFL   LOWER EXTREMITY MMT: Right hip strength 4/5, Right hamstring strength of 4/5, R quad strength of 5/5.  LLE strength of 5/5.   LOWER EXTREMITY SPECIAL TESTS:  Knee special tests: Anterior drawer test: negative and Posterior drawer test: negative   FUNCTIONAL TESTS:  5 times sit to stand: 10.8 sec without UE assist Timed up and go (TUG): 7.1 sec   GAIT: Distance walked: >500 ft  Assistive device utilized: None Level of assistance: Complete Independence Comments: Pt with antalgic gait as distance increases       TODAY'S TREATMENT:    05/24/22: Review of initial HEP: Excellent demo of all Nustep L1 5 min; PTA present  to discuss status. Standing  heel raises: Bil 10x, RTLE 6x Tree pose RTLE: 30 sec no UE 6" step ups with 1 UE 10x forward: side step 10x 1 UE      05/15/22:   Seated LAQ with hip adduction ball squeeze 2x10 RLE Standing:  marching, hip abduction, hip extension.  BLE x10 each Seated hamstring stretch x30 sec RLE     PATIENT EDUCATION:  Education details: Issued HEP Person educated: Patient Education method: Explanation, Demonstration, and Handouts Education comprehension: verbalized understanding and returned demonstration     HOME EXERCISE PROGRAM: Access Code: [email or web address] URL: [email or web address] Date: 05/15/2022 Prepared by: Shelby Dubin Menke   Exercises - Seated Hamstring Stretch  - 1 x daily - 7 x weekly - 1 sets - 2 reps - 20 sec hold - Seated Long Arc  Quad with Hip Adduction  - 1 x daily - 7 x weekly - 2 sets - 10 reps - Standing March with Counter Support  - 1 x daily - 7 x weekly - 2 sets - 10 reps - Mini Squat with Counter Support  - 1 x daily - 7 x weekly - 2 sets - 10 reps - Standing Hip Abduction with Counter Support  - 1 x daily - 7 x weekly - 2 sets - 10 reps - Standing Hip Extension with Counter Support  - 1 x daily - 7 x weekly - 2 sets - 10 reps - Supine Active Straight Leg Raise  - 1 x daily - 7 x weekly - 2 sets - 10 reps - Sidelying Hip Abduction  - 1 x daily - 7 x weekly - 2 sets - 10 reps - Supine Bridge  - 1 x daily - 7 x weekly - 2 sets - 10 reps  Added on 05/24/22: Myrene Galas, PTA Tree Pose, standing heel raises. 05/24/22 8:49 AM    ASSESSMENT:   CLINICAL IMPRESSION: Pt is independent and compliant in initial HEP. A few exercises given today for advancement. Very minimal verbal cuing on only suggestions to make exercise more precise. Pt is also going to continue with her water exercises. No pain throuhgout todays session.     OBJECTIVE IMPAIRMENTS decreased balance, difficulty walking, decreased strength, impaired flexibility, and pain.    ACTIVITY LIMITATIONS squatting   PARTICIPATION LIMITATIONS: community activity   PERSONAL FACTORS Time since onset of injury/illness/exacerbation and 3+ comorbidities: R THA on 01/18/22, OA, pre-diabetes  are also affecting patient's functional outcome.    REHAB POTENTIAL: Good   CLINICAL DECISION MAKING: Evolving/moderate complexity   EVALUATION COMPLEXITY: Moderate     GOALS: Goals reviewed with patient? Yes   SHORT TERM GOALS: Target date: 06/05/2022  Pt will be independent with initial HEP. Baseline: Goal status: INITIAL   2.  Pt to report at least a 40% improvement in symptoms. Baseline:  Goal status: INITIAL     LONG TERM GOALS: Target date: 07/05/2022    Pt will be independent with advanced HEP. Baseline:  Goal status: INITIAL   2.  Pt will increase  LEFS to at least 75% to demonstrate her ability to perform functional tasks. Baseline: 67.5% Goal status: INITIAL   3.  Pt to increase right hip and hamstring strength to at least 4+ to 5-/5 to allow her to easily perform car transfers and stairs with reciprocal pattern. Baseline: 4/5 Goal status: INITIAL   4.  Pt to report ability to walk her 2 mile path and walk her dog without increase in pain. Baseline:  Goal status: INITIAL  PLAN: PT FREQUENCY: 1x/week   PT DURATION: 8 weeks   PLANNED INTERVENTIONS: Therapeutic exercises, Therapeutic activity, Neuromuscular re-education, Balance training, Gait training, Patient/Family education, Self Care, Joint mobilization, Joint manipulation, Stair training, Aquatic Therapy, Dry Needling, Electrical stimulation, Cryotherapy, Moist heat, Taping, Vasopneumatic device, Ultrasound, Ionotophoresis '4mg'$ /ml Dexamethasone, Manual therapy, and Re-evaluation   PLAN FOR NEXT SESSION: Progress quad strength, RTLE proprioception     Tytianna Greenley, PTA 05/24/2022, 8:49 AM

## 2022-05-29 ENCOUNTER — Encounter: Payer: Self-pay | Admitting: Rehabilitative and Restorative Service Providers"

## 2022-05-29 ENCOUNTER — Ambulatory Visit: Payer: Medicare Other | Admitting: Rehabilitative and Restorative Service Providers"

## 2022-05-29 DIAGNOSIS — R2689 Other abnormalities of gait and mobility: Secondary | ICD-10-CM | POA: Diagnosis not present

## 2022-05-29 DIAGNOSIS — M6281 Muscle weakness (generalized): Secondary | ICD-10-CM

## 2022-05-29 NOTE — Therapy (Signed)
OUTPATIENT PHYSICAL THERAPY TREATMENT NOTE   Patient Name: Nichole [name] MRN: [id number] DOB:[birth year], 76 y.o., female Today's Date: 05/29/2022  PCP: Lajean Manes, MD REFERRING PROVIDER: Thurman Coyer, DO   END OF SESSION:   PT End of Session - 05/29/22 0936     Visit Number 3    Date for PT Re-Evaluation 07/05/22    Authorization Type [employer] Medicare    Progress Note Due on Visit 10    PT Start Time 0930    PT Stop Time 1010    PT Time Calculation (min) 40 min    Activity Tolerance Patient tolerated treatment well    Behavior During Therapy WFL for tasks assessed/performed             Past Medical History:  Diagnosis Date   Anxiety    Arthritis    HDL deficiency    Pre-diabetes    Diet controlled   Past Surgical History:  Procedure Laterality Date   COLONOSCOPY     EYE SURGERY Bilateral 2018   cateract   SHOULDER ARTHROSCOPY Right 1997   TONSILLECTOMY     age 89   TOTAL HIP ARTHROPLASTY Right 01/18/2022   Procedure: RIGHT TOTAL HIP ARTHROPLASTY ANTERIOR APPROACH;  Surgeon: Mcarthur Rossetti, MD;  Location: [facility];  Service: Orthopedics;  Laterality: Right;   Patient Active Problem List   Diagnosis Date Noted   Status post total replacement of right hip 01/18/2022   Unilateral primary osteoarthritis, right hip 12/24/2021    REFERRING DIAG: M25.561 (ICD-10-CM) - Right knee pain, unspecified chronicity    THERAPY DIAG:  Other abnormalities of gait and mobility  Muscle weakness (generalized)  Rationale for Evaluation and Treatment Rehabilitation  PERTINENT HISTORY: S/p R THA on 01/18/22, pre-diabetes, OA    PRECAUTIONS: None  SUBJECTIVE: Pt reports that she can feel that she is getting stronger.  PAIN:  Are you having pain? No   OBJECTIVE: (objective measures completed at initial evaluation unless otherwise dated)  DIAGNOSTIC FINDINGS:  R knee radiograph on 05/07/22:   No recent fracture is seen. Degenerative changes are noted in medial  and patellofemoral compartments   PATIENT SURVEYS:  LEFS  54 / 80 = 67.5 %R   COGNITION:           Overall cognitive status: Within functional limits for tasks assessed                          SENSATION: WFL   EDEMA:  Circumferential: R knee 37.5 cm, L knee 36.5 cm   MUSCLE LENGTH: Right hamstring tightness noted (pt with history of 2 hamstring tears)   POSTURE: No Significant postural limitations   PALPATION: Tender to palpation along right knee with edema noted   LOWER EXTREMITY ROM: WFL   LOWER EXTREMITY MMT: Right hip strength 4/5, Right hamstring strength of 4/5, R quad strength of 5/5.  LLE strength of 5/5.   LOWER EXTREMITY SPECIAL TESTS:  Knee special tests: Anterior drawer test: negative and Posterior drawer test: negative   FUNCTIONAL TESTS:  5 times sit to stand: 10.8 sec without UE assist Timed up and go (TUG): 7.1 sec   GAIT: Distance walked: >500 ft  Assistive device utilized: None Level of assistance: Complete Independence Comments: Pt with antalgic gait as distance increases       TODAY'S TREATMENT:  05/29/2022: Nustep level 3 with LE only x7 min with PT present to discuss status  Seated with 2#  LAQ with ball squeeze 2x10 bilat Standing marching with 2# 2x10 bilat Seated HS curl with red tband 2x10 bilat Standing hamstring stretch at the step 2x20 sec bilat Standing hip flexor stretch at step 2x20 sec bilat Standing hip flexion, abduction, and extension with green loop 2x10 bilat   05/24/22: Review of initial HEP: Excellent demo of all Nustep L1 5 min; PTA present  to discuss status. Standing heel raises: Bil 10x, RTLE 6x Tree pose RTLE: 30 sec no UE 6" step ups with 1 UE 10x forward: side step 10x 1 UE      05/15/22:   Seated LAQ with hip adduction ball squeeze 2x10 RLE Standing:  marching, hip abduction, hip extension.  BLE x10 each Seated hamstring stretch x30 sec RLE     PATIENT EDUCATION:  Education details: Issued HEP Person  educated: Patient Education method: Explanation, Demonstration, and Handouts Education comprehension: verbalized understanding and returned demonstration     HOME EXERCISE PROGRAM: Access Code: [email or web address] URL: [email or web address] Date: 05/15/2022 Prepared by: Shelby Dubin [place] Grayer   Exercises - Seated Hamstring Stretch  - 1 x daily - 7 x weekly - 1 sets - 2 reps - 20 sec hold - Seated Long Arc Quad with Hip Adduction  - 1 x daily - 7 x weekly - 2 sets - 10 reps - Standing March with Counter Support  - 1 x daily - 7 x weekly - 2 sets - 10 reps - Mini Squat with Counter Support  - 1 x daily - 7 x weekly - 2 sets - 10 reps - Standing Hip Abduction with Counter Support  - 1 x daily - 7 x weekly - 2 sets - 10 reps - Standing Hip Extension with Counter Support  - 1 x daily - 7 x weekly - 2 sets - 10 reps - Supine Active Straight Leg Raise  - 1 x daily - 7 x weekly - 2 sets - 10 reps - Sidelying Hip Abduction  - 1 x daily - 7 x weekly - 2 sets - 10 reps - Supine Bridge  - 1 x daily - 7 x weekly - 2 sets - 10 reps  Added on 05/24/22: Myrene Galas, PTA Tree Pose, standing heel raises. 05/24/22 8:49 AM    ASSESSMENT:   CLINICAL IMPRESSION: Pt is progressing with goal related activities.  Pt tolerated session well and was able to progress with resistance during session.  Minor cuing for technique for exercises required during session.  Pt continues to participate in aquatic aerobics through her gym and states that she is feeling better with sessions.  Pt continues to require skilled PT to progress towards goal related activities.    OBJECTIVE IMPAIRMENTS decreased balance, difficulty walking, decreased strength, impaired flexibility, and pain.    ACTIVITY LIMITATIONS squatting   PARTICIPATION LIMITATIONS: community activity   PERSONAL FACTORS Time since onset of injury/illness/exacerbation and 3+ comorbidities: R THA on 01/18/22, OA, pre-diabetes  are also affecting patient's  functional outcome.    REHAB POTENTIAL: Good   CLINICAL DECISION MAKING: Evolving/moderate complexity   EVALUATION COMPLEXITY: Moderate     GOALS: Goals reviewed with patient? Yes   SHORT TERM GOALS: Target date: 06/05/2022  Pt will be independent with initial HEP. Baseline: Goal status: IN PROGRESS   2.  Pt to report at least a 40% improvement in symptoms. Baseline:  Goal status: INITIAL     LONG TERM GOALS: Target date: 07/05/2022    Pt will be independent with advanced HEP. Baseline:  Goal status: INITIAL   2.  Pt will increase LEFS to at least 75% to demonstrate her ability to perform functional tasks. Baseline: 67.5% Goal status: INITIAL   3.  Pt to increase right hip and hamstring strength to at least 4+ to 5-/5 to allow her to easily perform car transfers and stairs with reciprocal pattern. Baseline: 4/5 Goal status: INITIAL   4.  Pt to report ability to walk her 2 mile path and walk her dog without increase in pain. Baseline:  Goal status: INITIAL     PLAN: PT FREQUENCY: 1x/week   PT DURATION: 8 weeks   PLANNED INTERVENTIONS: Therapeutic exercises, Therapeutic activity, Neuromuscular re-education, Balance training, Gait training, Patient/Family education, Self Care, Joint mobilization, Joint manipulation, Stair training, Aquatic Therapy, Dry Needling, Electrical stimulation, Cryotherapy, Moist heat, Taping, Vasopneumatic device, Ultrasound, Ionotophoresis '4mg'$ /ml Dexamethasone, Manual therapy, and Re-evaluation   PLAN FOR NEXT SESSION: Progress quad strength, RTLE proprioception     Juel Burrow, PT 05/29/2022, 10:17 AM  [facility] [address] [place],  [address] Phone # [phone] Fax [phone]

## 2022-06-12 ENCOUNTER — Encounter: Payer: Self-pay | Admitting: Rehabilitative and Restorative Service Providers"

## 2022-06-12 ENCOUNTER — Ambulatory Visit: Payer: Medicare Other | Admitting: Rehabilitative and Restorative Service Providers"

## 2022-06-12 DIAGNOSIS — R2689 Other abnormalities of gait and mobility: Secondary | ICD-10-CM

## 2022-06-12 DIAGNOSIS — M6281 Muscle weakness (generalized): Secondary | ICD-10-CM

## 2022-06-12 NOTE — Therapy (Signed)
OUTPATIENT PHYSICAL THERAPY TREATMENT NOTE   Patient Name: Nichole [name] MRN: [id number] DOB:[birth year], 76 y.o., female Today's Date: 06/12/2022  PCP: Merlene Laughter, MD REFERRING PROVIDER: Ralene Cork, DO   END OF SESSION:   PT End of Session - 06/12/22 0929     Visit Number 4    Date for PT Re-Evaluation 07/05/22    Authorization Type [employer] Medicare    Progress Note Due on Visit 10    PT Start Time 0926    PT Stop Time 1007    PT Time Calculation (min) 41 min    Activity Tolerance Patient tolerated treatment well    Behavior During Therapy [facility] for tasks assessed/performed             Past Medical History:  Diagnosis Date   Anxiety    Arthritis    HDL deficiency    Pre-diabetes    Diet controlled   Past Surgical History:  Procedure Laterality Date   COLONOSCOPY     EYE SURGERY Bilateral 2018   cateract   SHOULDER ARTHROSCOPY Right 1997   TONSILLECTOMY     age 22   TOTAL HIP ARTHROPLASTY Right 01/18/2022   Procedure: RIGHT TOTAL HIP ARTHROPLASTY ANTERIOR APPROACH;  Surgeon: Kathryne Hitch, MD;  Location: [facility];  Service: Orthopedics;  Laterality: Right;   Patient Active Problem List   Diagnosis Date Noted   Status post total replacement of right hip 01/18/2022   Unilateral primary osteoarthritis, right hip 12/24/2021    REFERRING DIAG: M25.561 (ICD-10-CM) - Right knee pain, unspecified chronicity    THERAPY DIAG:  Other abnormalities of gait and mobility  Muscle weakness (generalized)  Rationale for Evaluation and Treatment Rehabilitation  PERTINENT HISTORY: S/p R THA on 01/18/22, pre-diabetes, OA    PRECAUTIONS: None  SUBJECTIVE: Pt reports that she can feel that she is getting stronger.  PAIN:  Are you having pain? Yes: NPRS scale: 4/10 Pain location: right medial knee Pain description: burning and soreness Aggravating factors: walking for prolonged periods Relieving factors: rest and change in position   OBJECTIVE: (objective  measures completed at initial evaluation unless otherwise dated)  DIAGNOSTIC FINDINGS:  R knee radiograph on 05/07/22:   No recent fracture is seen. Degenerative changes are noted in medial and patellofemoral compartments   PATIENT SURVEYS:  LEFS  54 / 80 = 67.5 %R   COGNITION:           Overall cognitive status: Within functional limits for tasks assessed                          SENSATION: WFL   EDEMA:  Circumferential: R knee 37.5 cm, L knee 36.5 cm   MUSCLE LENGTH: Right hamstring tightness noted (pt with history of 2 hamstring tears)   POSTURE: No Significant postural limitations   PALPATION: Tender to palpation along right knee with edema noted   LOWER EXTREMITY ROM: WFL   LOWER EXTREMITY MMT: Right hip strength 4/5, Right hamstring strength of 4/5, R quad strength of 5/5.  LLE strength of 5/5.   LOWER EXTREMITY SPECIAL TESTS:  Knee special tests: Anterior drawer test: negative and Posterior drawer test: negative   FUNCTIONAL TESTS:  5 times sit to stand: 10.8 sec without UE assist Timed up and go (TUG): 7.1 sec   GAIT: Distance walked: >500 ft  Assistive device utilized: None Level of assistance: Complete Independence Comments: Pt with antalgic gait as distance increases  TODAY'S TREATMENT:  06/12/2022: Nustep level 4 with LE only x7 min with PT present to discuss status Seated with 2# LAQ with ball squeeze 2x10 bilat Standing marching with 2# 5V74 bilat Application of iontopatch with $RemoveBeforeD'4mg'ewnqgZynVhxewW$ /mL Dexamethasone to leave on for 4 hours Seated HS curl with red tband 2x10 bilat Standing hamstring stretch at the step 2x20 sec bilat Standing hip flexor stretch at step 2x20 sec bilat Standing hip flexion, abduction, and extension with green loop 2x10 bilat Standing calf stretch on slanted rocker board 2x20 sec Standing heel raises 2x10 bilat Resisted backwards walking with 5# cable pulley x10   05/29/2022: Nustep level 3 with LE only x7 min with PT present  to discuss status  Seated with 2# LAQ with ball squeeze 2x10 bilat Standing marching with 2# 2x10 bilat Seated HS curl with red tband 2x10 bilat Standing hamstring stretch at the step 2x20 sec bilat Standing hip flexor stretch at step 2x20 sec bilat Standing hip flexion, abduction, and extension with green loop 2x10 bilat   05/24/22: Review of initial HEP: Excellent demo of all Nustep L1 5 min; PTA present  to discuss status. Standing heel raises: Bil 10x, RTLE 6x Tree pose RTLE: 30 sec no UE 6" step ups with 1 UE 10x forward: side step 10x 1 UE        PATIENT EDUCATION:  Education details: Issued HEP Person educated: Patient Education method: Explanation, Demonstration, and Handouts Education comprehension: verbalized understanding and returned demonstration     HOME EXERCISE PROGRAM: Access Code: [email or web address] URL: [email or web address] Date: 05/15/2022 Prepared by: Shelby Dubin Tonantzin Mimnaugh   Exercises - Seated Hamstring Stretch  - 1 x daily - 7 x weekly - 1 sets - 2 reps - 20 sec hold - Seated Long Arc Quad with Hip Adduction  - 1 x daily - 7 x weekly - 2 sets - 10 reps - Standing March with Counter Support  - 1 x daily - 7 x weekly - 2 sets - 10 reps - Mini Squat with Counter Support  - 1 x daily - 7 x weekly - 2 sets - 10 reps - Standing Hip Abduction with Counter Support  - 1 x daily - 7 x weekly - 2 sets - 10 reps - Standing Hip Extension with Counter Support  - 1 x daily - 7 x weekly - 2 sets - 10 reps - Supine Active Straight Leg Raise  - 1 x daily - 7 x weekly - 2 sets - 10 reps - Sidelying Hip Abduction  - 1 x daily - 7 x weekly - 2 sets - 10 reps - Supine Bridge  - 1 x daily - 7 x weekly - 2 sets - 10 reps  Added on 05/24/22: Myrene Galas, PTA Tree Pose, standing heel raises. 05/24/22 8:49 AM    ASSESSMENT:   CLINICAL IMPRESSION: Nichole [name] presents to skilled PT reporting that her right hip is feeling much stronger, but she is having still having right knee pain  and is interested in trying iontopatch today.  Pt educated about iontopatch and to remove patch in 4 hours, or sooner is noting any adverse reaction.  Pt verbalizes understanding.  Pt tolerated strengthening well and states that she has been able to return to her chair yoga classes.  Pt continues to require skilled PT to further progress towards improved strengthening and decreased pain.    OBJECTIVE IMPAIRMENTS decreased balance, difficulty walking, decreased strength, impaired flexibility, and pain.    ACTIVITY LIMITATIONS squatting   PARTICIPATION  LIMITATIONS: community activity   PERSONAL FACTORS Time since onset of injury/illness/exacerbation and 3+ comorbidities: R THA on 01/18/22, OA, pre-diabetes  are also affecting patient's functional outcome.    REHAB POTENTIAL: Good   CLINICAL DECISION MAKING: Evolving/moderate complexity   EVALUATION COMPLEXITY: Moderate     GOALS: Goals reviewed with patient? Yes   SHORT TERM GOALS: Target date: 06/05/2022  Pt will be independent with initial HEP. Baseline: Goal status: MET 06/12/2022   2.  Pt to report at least a 40% improvement in symptoms. Baseline:  Goal status: INITIAL     LONG TERM GOALS: Target date: 07/05/2022    Pt will be independent with advanced HEP. Baseline:  Goal status: INITIAL   2.  Pt will increase LEFS to at least 75% to demonstrate her ability to perform functional tasks. Baseline: 67.5% Goal status: INITIAL   3.  Pt to increase right hip and hamstring strength to at least 4+ to 5-/5 to allow her to easily perform car transfers and stairs with reciprocal pattern. Baseline: 4/5 Goal status: INITIAL   4.  Pt to report ability to walk her 2 mile path and walk her dog without increase in pain. Baseline:  Goal status: IN PROGRESS     PLAN: PT FREQUENCY: 1x/week   PT DURATION: 8 weeks   PLANNED INTERVENTIONS: Therapeutic exercises, Therapeutic activity, Neuromuscular re-education, Balance training, Gait  training, Patient/Family education, Self Care, Joint mobilization, Joint manipulation, Stair training, Aquatic Therapy, Dry Needling, Electrical stimulation, Cryotherapy, Moist heat, Taping, Vasopneumatic device, Ultrasound, Ionotophoresis 4mg /ml Dexamethasone, Manual therapy, and Re-evaluation   PLAN FOR NEXT SESSION: Assess response to iontopatch and apply patch 2/6 if indicated, progress quad strength, RTLE proprioception     Juel Burrow, PT 06/12/2022, 10:16 AM  [facility] [address], [place], [place] [address] Phone # [phone] Fax [phone]

## 2022-06-12 NOTE — Patient Instructions (Signed)

## 2022-06-17 ENCOUNTER — Other Ambulatory Visit (HOSPITAL_BASED_OUTPATIENT_CLINIC_OR_DEPARTMENT_OTHER): Payer: Self-pay

## 2022-06-17 MED ORDER — ALENDRONATE SODIUM 70 MG PO TABS
ORAL_TABLET | ORAL | 11 refills | Status: DC
Start: 2022-06-17 — End: 2023-07-09
  Filled 2022-06-17: qty 12, 84d supply, fill #0
  Filled 2022-09-03: qty 12, 84d supply, fill #1
  Filled 2023-01-06: qty 12, 84d supply, fill #2
  Filled 2023-04-05: qty 12, 84d supply, fill #3

## 2022-06-18 ENCOUNTER — Other Ambulatory Visit (HOSPITAL_BASED_OUTPATIENT_CLINIC_OR_DEPARTMENT_OTHER): Payer: Self-pay

## 2022-06-18 MED ORDER — FLUAD QUADRIVALENT 0.5 ML IM PRSY
PREFILLED_SYRINGE | INTRAMUSCULAR | 0 refills | Status: DC
  Filled 2022-06-18: qty 0.5, 1d supply, fill #0

## 2022-06-19 ENCOUNTER — Ambulatory Visit: Payer: Medicare Other | Attending: Sports Medicine | Admitting: Rehabilitative and Restorative Service Providers"

## 2022-06-19 ENCOUNTER — Encounter: Payer: Self-pay | Admitting: Rehabilitative and Restorative Service Providers"

## 2022-06-19 DIAGNOSIS — M6281 Muscle weakness (generalized): Secondary | ICD-10-CM

## 2022-06-19 DIAGNOSIS — R2689 Other abnormalities of gait and mobility: Secondary | ICD-10-CM | POA: Diagnosis present

## 2022-06-19 NOTE — Therapy (Signed)
OUTPATIENT PHYSICAL THERAPY TREATMENT NOTE   Patient Name: MELISSSA DONNER MRN: [id number] DOB:07-10-1946, 76 y.o., female Today's Date: 06/19/2022  PCP: Lajean Manes, MD REFERRING PROVIDER: Thurman Coyer, DO   END OF SESSION:   PT End of Session - 06/19/22 0929     Visit Number 5    Date for PT Re-Evaluation 07/05/22    Authorization Type [employer] Medicare    Progress Note Due on Visit 10    PT Start Time 0925    PT Stop Time 1005    PT Time Calculation (min) 40 min    Activity Tolerance Patient tolerated treatment well    Behavior During Therapy [facility] for tasks assessed/performed             Past Medical History:  Diagnosis Date   Anxiety    Arthritis    HDL deficiency    Pre-diabetes    Diet controlled   Past Surgical History:  Procedure Laterality Date   COLONOSCOPY     EYE SURGERY Bilateral 2018   cateract   SHOULDER ARTHROSCOPY Right 1997   TONSILLECTOMY     age 71   TOTAL HIP ARTHROPLASTY Right 01/18/2022   Procedure: RIGHT TOTAL HIP ARTHROPLASTY ANTERIOR APPROACH;  Surgeon: Mcarthur Rossetti, MD;  Location: [facility];  Service: Orthopedics;  Laterality: Right;   Patient Active Problem List   Diagnosis Date Noted   Status post total replacement of right hip 01/18/2022   Unilateral primary osteoarthritis, right hip 12/24/2021    REFERRING DIAG: M25.561 (ICD-10-CM) - Right knee pain, unspecified chronicity    THERAPY DIAG:  Other abnormalities of gait and mobility  Muscle weakness (generalized)  Rationale for Evaluation and Treatment Rehabilitation  PERTINENT HISTORY: S/p R THA on 01/18/22, pre-diabetes, OA    PRECAUTIONS: None  SUBJECTIVE: Pt reports that she is still having knee pain, but does feel better with doing exercises.  Pt reports that she was able to do yoga.  Pt reports overall, she is around 50% improvement since initial evaluation.  States that she did not notice any improvements with iontopatch.  PAIN:  Are you having pain? Yes:  NPRS scale: 2-4/10 Pain location: right medial knee Pain description: burning and soreness Aggravating factors: walking for prolonged periods Relieving factors: rest and change in position   OBJECTIVE: (objective measures completed at initial evaluation unless otherwise dated)  DIAGNOSTIC FINDINGS:  R knee radiograph on 05/07/22:   No recent fracture is seen. Degenerative changes are noted in medial and patellofemoral compartments   PATIENT SURVEYS:  Eval:  LEFS  54 / 80 = 67.5 %R   COGNITION:           Overall cognitive status: Within functional limits for tasks assessed                          SENSATION: WFL   EDEMA:  Circumferential: R knee 37.5 cm, L knee 36.5 cm   MUSCLE LENGTH: Right hamstring tightness noted (pt with history of 2 hamstring tears)   POSTURE: No Significant postural limitations   PALPATION: Tender to palpation along right knee with edema noted   LOWER EXTREMITY ROM: WFL   LOWER EXTREMITY MMT: Right hip strength 4/5, Right hamstring strength of 4/5, R quad strength of 5/5.  LLE strength of 5/5.   LOWER EXTREMITY SPECIAL TESTS:  Knee special tests: Anterior drawer test: negative and Posterior drawer test: negative   FUNCTIONAL TESTS:  5 times sit to  stand: 10.8 sec without UE assist Timed up and go (TUG): 7.1 sec   GAIT: Distance walked: >500 ft  Assistive device utilized: None Level of assistance: Complete Independence Comments: Pt with antalgic gait as distance increases       TODAY'S TREATMENT:  06/19/2022: Nustep level 4 x7 min with PT present to discuss status Trigger Point Dry-Needling  Treatment instructions: Expect mild to moderate muscle soreness. S/S of pneumothorax if dry needled over a lung field, and to seek immediate medical attention should they occur. Patient verbalized understanding of these instructions and education. Patient Consent Given: Yes Education handout provided: Yes Muscles treated: right quad Electrical  stimulation performed: No Parameters: N/A Treatment response/outcome: Able to identify trigger points using skilled palpation.  Able to palpate muscle twitch and elongation with dry needling. Manual Therapy:  using silicone cup performed myofascial tissue gliding to bilateral quads.  Soft tissue mobilization to bilat quads and right hamstring. Seated with 2# LAQ with ball squeeze 2x10 bilat Standing heel raises 2x10 bilat with 2# Standing mini squats 2x10 Standing hamstring stretch at the step 2x20 sec bilat Standing hip flexor stretch at step 2x20 sec bilat   06/12/2022: Nustep level 4 with LE only x7 min with PT present to discuss status Seated with 2# LAQ with ball squeeze 2x10 bilat Standing marching with 2# 4C95 bilat Application of iontopatch with $RemoveBeforeD'4mg'PwteVftYqKLWEZ$ /mL Dexamethasone to leave on for 4 hours Seated HS curl with red tband 2x10 bilat Standing hamstring stretch at the step 2x20 sec bilat Standing hip flexor stretch at step 2x20 sec bilat Standing hip flexion, abduction, and extension with green loop 2x10 bilat Standing calf stretch on slanted rocker board 2x20 sec Standing heel raises 2x10 bilat Resisted backwards walking with 5# cable pulley x10   05/29/2022: Nustep level 3 with LE only x7 min with PT present to discuss status  Seated with 2# LAQ with ball squeeze 2x10 bilat Standing marching with 2# 2x10 bilat Seated HS curl with red tband 2x10 bilat Standing hamstring stretch at the step 2x20 sec bilat Standing hip flexor stretch at step 2x20 sec bilat Standing hip flexion, abduction, and extension with green loop 2x10 bilat         PATIENT EDUCATION:  Education details: Issued HEP Person educated: Patient Education method: Explanation, Demonstration, and Handouts Education comprehension: verbalized understanding and returned demonstration     HOME EXERCISE PROGRAM: Access Code: [email or web address] URL: [email or web address] Date: 05/15/2022 Prepared by:  Shelby Dubin Kiernan Farkas   Exercises - Seated Hamstring Stretch  - 1 x daily - 7 x weekly - 1 sets - 2 reps - 20 sec hold - Seated Long Arc Quad with Hip Adduction  - 1 x daily - 7 x weekly - 2 sets - 10 reps - Standing March with Counter Support  - 1 x daily - 7 x weekly - 2 sets - 10 reps - Mini Squat with Counter Support  - 1 x daily - 7 x weekly - 2 sets - 10 reps - Standing Hip Abduction with Counter Support  - 1 x daily - 7 x weekly - 2 sets - 10 reps - Standing Hip Extension with Counter Support  - 1 x daily - 7 x weekly - 2 sets - 10 reps - Supine Active Straight Leg Raise  - 1 x daily - 7 x weekly - 2 sets - 10 reps - Sidelying Hip Abduction  - 1 x daily - 7 x weekly - 2 sets - 10 reps - Supine Bridge  -  1 x daily - 7 x weekly - 2 sets - 10 reps  Added on 05/24/22: Myrene Galas, PTA Tree Pose, standing heel raises. 05/24/22 8:49 AM    ASSESSMENT:   CLINICAL IMPRESSION: [name] presents to skilled PT reporting that overall she has made some progress, but has noticed waking up in the middle of the night with bilateral knee pain.  Pt states that she did not note any improvement with use of iontopatch.  Educated pt on Dry Needling and she was interested in trying.  Following dry needling, pt reported immediate relaxation of her muscles and decreased pain to right knee.  Followed with use of manual therapy and myofascial gliding with cupping and pt reported decreased pain following.  During ambulation following treatment, pt reported a significant improvement in her symptoms.  Pt continues to require skilled PT to progress towards goal related activities.    OBJECTIVE IMPAIRMENTS decreased balance, difficulty walking, decreased strength, impaired flexibility, and pain.    ACTIVITY LIMITATIONS squatting   PARTICIPATION LIMITATIONS: community activity   PERSONAL FACTORS Time since onset of injury/illness/exacerbation and 3+ comorbidities: R THA on 01/18/22, OA, pre-diabetes  are also affecting  patient's functional outcome.    REHAB POTENTIAL: Good   CLINICAL DECISION MAKING: Evolving/moderate complexity   EVALUATION COMPLEXITY: Moderate     GOALS: Goals reviewed with patient? Yes   SHORT TERM GOALS: Target date: 06/05/2022  Pt will be independent with initial HEP. Baseline: Goal status: MET 06/12/2022   2.  Pt to report at least a 40% improvement in symptoms. Baseline:  Goal status: MET on 06/19/2022 (reporting 50% improvement)     LONG TERM GOALS: Target date: 07/05/2022    Pt will be independent with advanced HEP. Baseline:  Goal status: IN PROGRESS   2.  Pt will increase LEFS to at least 75% to demonstrate her ability to perform functional tasks. Baseline: 67.5% Goal status: INITIAL   3.  Pt to increase right hip and hamstring strength to at least 4+ to 5-/5 to allow her to easily perform car transfers and stairs with reciprocal pattern. Baseline: 4/5 Goal status: INITIAL   4.  Pt to report ability to walk her 2 mile path and walk her dog without increase in pain. Baseline:  Goal status: IN PROGRESS     PLAN: PT FREQUENCY: 1x/week   PT DURATION: 8 weeks   PLANNED INTERVENTIONS: Therapeutic exercises, Therapeutic activity, Neuromuscular re-education, Balance training, Gait training, Patient/Family education, Self Care, Joint mobilization, Joint manipulation, Stair training, Aquatic Therapy, Dry Needling, Electrical stimulation, Cryotherapy, Moist heat, Taping, Vasopneumatic device, Ultrasound, Ionotophoresis 4mg /ml Dexamethasone, Manual therapy, and Re-evaluation   PLAN FOR NEXT SESSION: Assess response to dry needling.  Manual therapy/dry needling as indicated, progress quad strength, RTLE proprioception     Juel Burrow, PT 06/19/2022, 10:09 AM  [facility] [address], [place],  [address] Phone # [phone] Fax [phone]

## 2022-06-19 NOTE — Patient Instructions (Signed)

## 2022-06-26 ENCOUNTER — Encounter: Payer: Self-pay | Admitting: Rehabilitative and Restorative Service Providers"

## 2022-06-26 ENCOUNTER — Ambulatory Visit: Payer: Medicare Other | Admitting: Rehabilitative and Restorative Service Providers"

## 2022-06-26 DIAGNOSIS — R2689 Other abnormalities of gait and mobility: Secondary | ICD-10-CM | POA: Diagnosis not present

## 2022-06-26 DIAGNOSIS — M6281 Muscle weakness (generalized): Secondary | ICD-10-CM

## 2022-06-26 NOTE — Therapy (Addendum)
OUTPATIENT PHYSICAL THERAPY TREATMENT NOTE AND LATE ENTRY DISCHARGE SUMMARY   Patient Name: Nichole [name] MRN: [id number] DOB:28-Feb-1946, 76 y.o., female Today's Date: 06/26/2022  PCP: Lajean Manes, MD REFERRING PROVIDER: Thurman Coyer, DO   END OF SESSION:   PT End of Session - 06/26/22 0934     Visit Number 6    Date for PT Re-Evaluation 07/05/22    Authorization Type [employer] Medicare    Progress Note Due on Visit 10    PT Start Time 0930    PT Stop Time 1001    PT Time Calculation (min) 31 min    Activity Tolerance Patient tolerated treatment well    Behavior During Therapy WFL for tasks assessed/performed             Past Medical History:  Diagnosis Date   Anxiety    Arthritis    HDL deficiency    Pre-diabetes    Diet controlled   Past Surgical History:  Procedure Laterality Date   COLONOSCOPY     EYE SURGERY Bilateral 2018   cateract   SHOULDER ARTHROSCOPY Right 1997   TONSILLECTOMY     age 56   TOTAL HIP ARTHROPLASTY Right 01/18/2022   Procedure: RIGHT TOTAL HIP ARTHROPLASTY ANTERIOR APPROACH;  Surgeon: Mcarthur Rossetti, MD;  Location: [facility];  Service: Orthopedics;  Laterality: Right;   Patient Active Problem List   Diagnosis Date Noted   Status post total replacement of right hip 01/18/2022   Unilateral primary osteoarthritis, right hip 12/24/2021    REFERRING DIAG: M25.561 (ICD-10-CM) - Right knee pain, unspecified chronicity    THERAPY DIAG:  Other abnormalities of gait and mobility  Muscle weakness (generalized)  Rationale for Evaluation and Treatment Rehabilitation  PERTINENT HISTORY: S/p R THA on 01/18/22, pre-diabetes, OA    PRECAUTIONS: None  SUBJECTIVE: Pt reports that she noticed decreased pain following dry needling and manual therapy.  States that the next day that she only had max of 2-3/10 pain with ambulation.  Pt reports on Friday, she started having some increased edema and on Sunday and Monday she had increased edema  noted and increased pain.  PAIN:  Are you having pain? Yes: NPRS scale: 0-6/10 Pain location: right medial knee Pain description: burning and soreness Aggravating factors: walking for prolonged periods Relieving factors: rest and change in position   OBJECTIVE: (objective measures completed at initial evaluation unless otherwise dated)  DIAGNOSTIC FINDINGS:  R knee radiograph on 05/07/22:   No recent fracture is seen. Degenerative changes are noted in medial and patellofemoral compartments   PATIENT SURVEYS:  Eval:  LEFS  54 / 80 = 67.5 %   COGNITION:           Overall cognitive status: Within functional limits for tasks assessed                          SENSATION: WFL   EDEMA:  Eval:  Circumferential: R knee 37.5 cm, L knee 36.5 cm 06/26/2022:  Circumferential: R knee 39 cm, L knee 37 cm   MUSCLE LENGTH: Right hamstring tightness noted (pt with history of 2 hamstring tears)   POSTURE: No Significant postural limitations   PALPATION: Tender to palpation along right knee with edema noted   LOWER EXTREMITY ROM: WFL   LOWER EXTREMITY MMT: Right hip strength 4/5, Right hamstring strength of 4/5, R quad strength of 5/5.  LLE strength of 5/5.   LOWER EXTREMITY SPECIAL TESTS:  Knee special tests: Anterior drawer test: negative and Posterior drawer test: negative   FUNCTIONAL TESTS:  Eval: 5 times sit to stand: 10.8 sec without UE assist Timed up and go (TUG): 7.1 sec   GAIT: Distance walked: >500 ft  Assistive device utilized: None Level of assistance: Complete Independence Comments: Pt with antalgic gait as distance increases       TODAY'S TREATMENT:  06/26/2022: Nustep level 5 x7 min with PT present to discuss status Seated with 2#:  heel/toe raises, marching, LAQ, hip abduction scissors.  BLE 2x10 Seated hip adduction ball squeeze 2x10 Seated hamstring stretch 2x20 sec bilat   06/19/2022: Nustep level 4 x7 min with PT present to discuss status Trigger  Point Dry-Needling  Treatment instructions: Expect mild to moderate muscle soreness. S/S of pneumothorax if dry needled over a lung field, and to seek immediate medical attention should they occur. Patient verbalized understanding of these instructions and education. Patient Consent Given: Yes Education handout provided: Yes Muscles treated: right quad Electrical stimulation performed: No Parameters: N/A Treatment response/outcome: Able to identify trigger points using skilled palpation.  Able to palpate muscle twitch and elongation with dry needling. Manual Therapy:  using silicone cup performed myofascial tissue gliding to bilateral quads.  Soft tissue mobilization to bilat quads and right hamstring. Seated with 2# LAQ with ball squeeze 2x10 bilat Standing heel raises 2x10 bilat with 2# Standing mini squats 2x10 Standing hamstring stretch at the step 2x20 sec bilat Standing hip flexor stretch at step 2x20 sec bilat   06/12/2022: Nustep level 4 with LE only x7 min with PT present to discuss status Seated with 2# LAQ with ball squeeze 2x10 bilat Standing marching with 2# 4B58 bilat Application of iontopatch with $RemoveBeforeD'4mg'cQMJzWAYNaKKUx$ /mL Dexamethasone to leave on for 4 hours Seated HS curl with red tband 2x10 bilat Standing hamstring stretch at the step 2x20 sec bilat Standing hip flexor stretch at step 2x20 sec bilat Standing hip flexion, abduction, and extension with green loop 2x10 bilat Standing calf stretch on slanted rocker board 2x20 sec Standing heel raises 2x10 bilat Resisted backwards walking with 5# cable pulley x10       PATIENT EDUCATION:  Education details: Issued HEP Person educated: Patient Education method: Explanation, Media planner, and Handouts Education comprehension: verbalized understanding and returned demonstration     HOME EXERCISE PROGRAM: Access Code: [email or web address] URL: [email or web address] Date: 05/15/2022 Prepared by: Shelby Dubin Keylee Shrestha   Exercises -  Seated Hamstring Stretch  - 1 x daily - 7 x weekly - 1 sets - 2 reps - 20 sec hold - Seated Long Arc Quad with Hip Adduction  - 1 x daily - 7 x weekly - 2 sets - 10 reps - Standing March with Counter Support  - 1 x daily - 7 x weekly - 2 sets - 10 reps - Mini Squat with Counter Support  - 1 x daily - 7 x weekly - 2 sets - 10 reps - Standing Hip Abduction with Counter Support  - 1 x daily - 7 x weekly - 2 sets - 10 reps - Standing Hip Extension with Counter Support  - 1 x daily - 7 x weekly - 2 sets - 10 reps - Supine Active Straight Leg Raise  - 1 x daily - 7 x weekly - 2 sets - 10 reps - Sidelying Hip Abduction  - 1 x daily - 7 x weekly - 2 sets - 10 reps - Supine Bridge  - 1 x daily - 7 x weekly -  2 sets - 10 reps  Added on 05/24/22: Myrene Galas, PTA Tree Pose, standing heel raises. 05/24/22 8:49 AM    ASSESSMENT:   CLINICAL IMPRESSION: Nichole [name] presents to skilled PT reporting having some increased pain and increased edema.  Measured knee circumference and noted a 1.5 cm increase in right knee.  Pt able to tolerate a shortened PT session with reports of having slight decrease in pain following session.  Pt to follow up with Dr Micheline Chapman tomorrow secondary to increased right knee edema and pain.  Pt continues to require skilled PT to progress towards goal related activities.    OBJECTIVE IMPAIRMENTS decreased balance, difficulty walking, decreased strength, impaired flexibility, and pain.    ACTIVITY LIMITATIONS squatting   PARTICIPATION LIMITATIONS: community activity   PERSONAL FACTORS Time since onset of injury/illness/exacerbation and 3+ comorbidities: R THA on 01/18/22, OA, pre-diabetes  are also affecting patient's functional outcome.    REHAB POTENTIAL: Good   CLINICAL DECISION MAKING: Evolving/moderate complexity   EVALUATION COMPLEXITY: Moderate     GOALS: Goals reviewed with patient? Yes   SHORT TERM GOALS: Target date: 06/05/2022  Pt will be independent with initial  HEP. Baseline: Goal status: MET 06/12/2022   2.  Pt to report at least a 40% improvement in symptoms. Baseline:  Goal status: MET on 06/19/2022 (reporting 50% improvement)     LONG TERM GOALS: Target date: 07/05/2022    Pt will be independent with advanced HEP. Baseline:  Goal status: IN PROGRESS   2.  Pt will increase LEFS to at least 75% to demonstrate her ability to perform functional tasks. Baseline: 67.5% Goal status: INITIAL   3.  Pt to increase right hip and hamstring strength to at least 4+ to 5-/5 to allow her to easily perform car transfers and stairs with reciprocal pattern. Baseline: 4/5 Goal status: INITIAL   4.  Pt to report ability to walk her 2 mile path and walk her dog without increase in pain. Baseline:  Goal status: IN PROGRESS     PLAN: PT FREQUENCY: 1x/week   PT DURATION: 8 weeks   PLANNED INTERVENTIONS: Therapeutic exercises, Therapeutic activity, Neuromuscular re-education, Balance training, Gait training, Patient/Family education, Self Care, Joint mobilization, Joint manipulation, Stair training, Aquatic Therapy, Dry Needling, Electrical stimulation, Cryotherapy, Moist heat, Taping, Vasopneumatic device, Ultrasound, Ionotophoresis 4mg /ml Dexamethasone, Manual therapy, and Re-evaluation   PLAN FOR NEXT SESSION: Ask about MD follow up.  Manual therapy/dry needling as indicated, progress quad strength, RTLE proprioception    PHYSICAL THERAPY DISCHARGE SUMMARY  Patient followed up with Dr Micheline Chapman and at that time, it was recommended that patient follow up with Dr Mayer Camel for a Total Knee Athroplasty, which she has since underwent.  Patient discharged at this time, will need new orders if PT indicated following TKA.  Patient agrees to discharge. Patient goals were partially met. Patient is being discharged due to a change in medical status.    Juel Burrow, PT 06/26/2022, 10:09 AM  [facility] [address], [place], [place] [address] Phone # [phone] Fax [phone]

## 2022-06-27 ENCOUNTER — Ambulatory Visit: Payer: Medicare Other | Admitting: Sports Medicine

## 2022-06-27 ENCOUNTER — Other Ambulatory Visit (HOSPITAL_BASED_OUTPATIENT_CLINIC_OR_DEPARTMENT_OTHER): Payer: Self-pay

## 2022-06-27 VITALS — BP 136/60

## 2022-06-27 DIAGNOSIS — M25561 Pain in right knee: Secondary | ICD-10-CM

## 2022-06-27 MED ORDER — PREDNISONE 10 MG PO TABS
ORAL_TABLET | ORAL | 0 refills | Status: DC
  Filled 2022-06-27 (×2): qty 21, 6d supply, fill #0

## 2022-06-27 NOTE — Progress Notes (Signed)
   Subjective:    Patient ID: Nichole [name], female    DOB: 30-Aug-1946, 76 y.o.   MRN: [id number]  HPI  Nichole [name] presents today with persistent right knee pain.  She has been in physical therapy for total of 6 visits for pes anserine bursitis.  However, her pain persists.  She is now endorsing swelling in the knee joint itself.  Pain has been intermittent but has been quite severe over the past several days.  She is now limping.  Previous x-rays showed moderate medial compartmental DJD.  Review of Systems As above    Objective:   Physical Exam  Well-developed, well-nourished.  No acute distress  Right knee: Good range of motion.  Trace effusion.  She is tender to palpation along the medial joint line with pain but no popping with McMurray's.  She is still tender to palpation over the pes anserine bursa as well.  Knee is stable ligamentous exam.  Neurovascularly intact distally.  X-rays are as above      Assessment & Plan:   Persistent right knee pain-rule out degenerative medial meniscal tear versus pes anserine bursitis  MRI of the right knee specifically to evaluate for degenerative medial meniscal tear.  We will also evaluate the pes anserine bursa.  She will follow-up with me in the office after the study to review those results and delineate further treatment.  She understands that if all of her symptoms are from osteoarthritis that definitive treatment may be a total knee arthroplasty.  For pain control we will do a 6-day Sterapred Dosepak.  Although she did have a reaction to a recent cortisone injection, she has taken oral prednisone in the past without any difficulties.  However, she is instructed to discontinue the oral steroids immediately if she begins to experience any flushing or nausea similar to what she did with the cortisone injection.  She understands.  This note was dictated using Dragon naturally speaking software and may contain errors in syntax, spelling, or content  which have not been identified prior to signing this note.

## 2022-07-03 ENCOUNTER — Ambulatory Visit: Payer: Medicare Other | Admitting: Rehabilitative and Restorative Service Providers"

## 2022-07-07 ENCOUNTER — Ambulatory Visit
Admission: RE | Admit: 2022-07-07 | Discharge: 2022-07-07 | Disposition: A | Discharge status: Home / Self Care | Payer: Medicare Other | Source: Ambulatory Visit | Attending: Sports Medicine | Admitting: Sports Medicine

## 2022-07-07 DIAGNOSIS — M25561 Pain in right knee: Secondary | ICD-10-CM

## 2022-07-09 ENCOUNTER — Ambulatory Visit (INDEPENDENT_AMBULATORY_CARE_PROVIDER_SITE_OTHER): Payer: Medicare Other | Admitting: Sports Medicine

## 2022-07-09 VITALS — Ht 64.5 in | Wt 145.0 lb

## 2022-07-09 DIAGNOSIS — M1711 Unilateral primary osteoarthritis, right knee: Secondary | ICD-10-CM | POA: Diagnosis not present

## 2022-07-09 NOTE — Patient Instructions (Signed)
[facility] Dr Mayer Camel Tues 07/16/22 '@230p'$  Arrival time is 2p [address] [place] [phone]

## 2022-07-09 NOTE — Progress Notes (Addendum)
Patient ID: Nichole [name], female   DOB: [birth year], 76 y.o.   MRN: [id number]  [name] presents today to discuss MRI findings of her right knee.  Dominant finding is severe medial compartmental osteoarthritis with associated subchondral marrow edema.  She also has a complex medial meniscal tear but I believe that her pain is originating from the advanced arthritis seen on the scan.  She has failed conservative treatment to date.  I recommended surgical consultation with Dr. Mayer Camel to discuss surgical options including total knee arthroplasty.  She is in agreement with that plan.  I will defer further work-up and treatment to the discretion of Dr. Mayer Camel and she will follow-up with me as needed.  This note was dictated using Dragon naturally speaking software and may contain errors in syntax, spelling, or content which have not been identified prior to signing this note.

## 2022-08-16 HISTORY — PX: KNEE ARTHROPLASTY: SHX992

## 2022-08-23 ENCOUNTER — Other Ambulatory Visit (HOSPITAL_BASED_OUTPATIENT_CLINIC_OR_DEPARTMENT_OTHER): Payer: Self-pay

## 2022-08-23 MED ORDER — AREXVY 120 MCG/0.5ML IM SUSR
INTRAMUSCULAR | 0 refills | Status: DC
Start: 2022-08-23 — End: 2022-09-10
  Filled 2022-08-23: qty 0.5, 1d supply, fill #0

## 2022-08-29 ENCOUNTER — Encounter: Payer: Self-pay | Admitting: Orthopaedic Surgery

## 2022-08-29 ENCOUNTER — Ambulatory Visit: Payer: Medicare Other | Admitting: Orthopaedic Surgery

## 2022-08-29 ENCOUNTER — Ambulatory Visit (INDEPENDENT_AMBULATORY_CARE_PROVIDER_SITE_OTHER): Payer: Medicare Other

## 2022-08-29 DIAGNOSIS — Z96641 Presence of right artificial hip joint: Secondary | ICD-10-CM

## 2022-08-29 NOTE — Progress Notes (Signed)
The patient is now around 7 months status post a right total hip arthroplasty through an anterior approach.  She is doing great other than having significant arthritis with the right knee.  She is actually scheduled soon for a knee replacement by Dr. Mayer Camel.  I told her she will do excellent with that surgery and this will certainly help her hip.  Her hip is been doing great other than some IT band pain and bursitis but I think I attributed this to her knee as well.  Her knee does have varus malalignment and her x-rays show significant arthritis of the right knee.  Today x-rays of her right hip and pelvis show well-seated total hip arthroplasty that is become bone ingrown.  It is in good alignment.  On exam her right hip moves smoothly and fluidly with no blocks to rotation and no pain at all.  At this point follow-up for her hip can be as needed.  If she develops any issues with that at all she should let us know.  I wish her the best luck on her knee replacement and do feel that she is someone who is very motivated and will do well.

## 2022-09-03 ENCOUNTER — Other Ambulatory Visit (HOSPITAL_BASED_OUTPATIENT_CLINIC_OR_DEPARTMENT_OTHER): Payer: Self-pay

## 2022-09-06 ENCOUNTER — Telehealth: Payer: Self-pay | Admitting: Adult Health

## 2022-09-06 MED ORDER — OXYCODONE-ACETAMINOPHEN 5-325 MG PO TABS: ORAL_TABLET | ORAL | 0 refills | Status: DC

## 2022-09-06 NOTE — Telephone Encounter (Signed)
Pt returned from [facility] after a knee replacement with no prescription for pain. Order given as per ortho.

## 2022-09-10 ENCOUNTER — Non-Acute Institutional Stay (SKILLED_NURSING_FACILITY): Payer: Medicare Other | Admitting: Internal Medicine

## 2022-09-10 ENCOUNTER — Encounter: Payer: Self-pay | Admitting: Internal Medicine

## 2022-09-10 DIAGNOSIS — Z96651 Presence of right artificial knee joint: Secondary | ICD-10-CM | POA: Diagnosis not present

## 2022-09-10 DIAGNOSIS — F419 Anxiety disorder, unspecified: Secondary | ICD-10-CM | POA: Diagnosis not present

## 2022-09-10 DIAGNOSIS — M816 Localized osteoporosis [Lequesne]: Secondary | ICD-10-CM

## 2022-09-10 DIAGNOSIS — E782 Mixed hyperlipidemia: Secondary | ICD-10-CM | POA: Diagnosis not present

## 2022-09-10 NOTE — Progress Notes (Signed)
Provider:   Location:  Occupational psychologist of Service:  SNF (61)  PCP: Lajean Manes, MD Patient Care Team: Lajean Manes, MD as PCP - General (Internal Medicine)  Extended Emergency Contact Information Primary Emergency Contact: Oh,ROBERT Address: [place], [place] [address] [place] Phone: [phone] Mobile Phone: [phone] Relation: Spouse  Code Status: fULL CODE  Goals of Care: Advanced Directive information    09/10/2022    1:38 PM  Advanced Directives  Does Patient Have a Medical Advance Directive? Yes  Type of Paramedic of [place];Living will  Does patient want to make changes to medical advance directive? No - Patient declined  Copy of [place] in Chart? Yes - validated most recent copy scanned in chart (See row information)      Chief Complaint  Patient presents with   New Admit To SNF    Patient is a new admit to SNF    HPI: Patient is a 76 y.o. female seen today for admission to SNF after undergoing Right TKA  Underwent Right TKA on 09/06/22  Patient has a history of osteoporosis and hyperlipidemia and anxiety  Right THA on 01/18/22  Admitted in rehab after Right TKA Doing well  Pain Controlled with Percocet and one Motrin a day Walking independent with the walker Doing her ADLS Wants to go back to her apartment on Thurs with her husband  Past Medical History:  Diagnosis Date   Anxiety    Arthritis    HDL deficiency    Pre-diabetes    Diet controlled   Past Surgical History:  Procedure Laterality Date   COLONOSCOPY     EYE SURGERY Bilateral 2018   cateract   SHOULDER ARTHROSCOPY Right 1997   TONSILLECTOMY     age 59   [place] Right 01/18/2022   Procedure: RIGHT TOTAL HIP ARTHROPLASTY ANTERIOR APPROACH;  Surgeon: Mcarthur Rossetti, MD;  Location: [facility];  Service: Orthopedics;  Laterality: Right;     reports that she has never smoked. She has never used smokeless tobacco. She reports current alcohol use of about 14.0 standard drinks of alcohol per week. She reports that she does not use drugs. Social History   Socioeconomic History   Marital status: Married    Spouse name: Not on file   Number of children: Not on file   Years of education: Not on file   Highest education level: Not on file  Occupational History   Not on file  Tobacco Use   Smoking status: Never   Smokeless tobacco: Never  Vaping Use   Vaping Use: Never used  Substance and Sexual Activity   Alcohol use: Yes    Alcohol/week: 14.0 standard drinks of alcohol    Types: 14 Glasses of wine per week   Drug use: Never   Sexual activity: Not on file  Other Topics Concern   Not on file  Social History Narrative   Not on file   Social Determinants of Health   Financial Resource Strain: Not on file  Food Insecurity: Not on file  Transportation Needs: Not on file  Physical Activity: Not on file  Stress: Not on file  Social Connections: Not on file  Intimate Partner Violence: Not on file    Functional Status Survey:    History reviewed. No pertinent family history.  Health Maintenance  Topic Date Due  Hepatitis C Screening  Never done   Pneumonia Vaccine 74+ Years old (1 - PCV) 09/20/2009   DEXA SCAN  Never done   Zoster Vaccines- Shingrix (2 of 2) 06/29/2019   Medicare Annual Wellness (AWV)  06/06/2021   COVID-19 Vaccine (3 - Moderna risk series) 08/14/2022   DTaP/Tdap/Td (2 - Td or Tdap) 10/18/2027   INFLUENZA VACCINE  Completed   HPV VACCINES  Aged Out    Allergies  Allergen Reactions   Corticosteroids Nausea And Vomiting    **ALLERGY IS TO STEROID INJECTIONS ONLY**    Outpatient Encounter Medications as of 09/10/2022  Medication Sig   acetaminophen (TYLENOL) 325 MG tablet Take 650 mg by mouth every 6 (six) hours as needed for mild pain.   alendronate (FOSAMAX) 70 MG tablet Take 1 tablet  by mouth once weekly, 30 minutes before the first food, beverage or medicine of the day with plain water.   CALCIUM PO Take 1,000 mg by mouth at bedtime.   Cholecalciferol (VITAMIN D) 50 MCG (2000 UT) tablet Take 2,000 Units by mouth daily.   docusate sodium (COLACE) 100 MG capsule Take 100 mg by mouth 2 (two) times daily.   ezetimibe (ZETIA) 10 MG tablet Take 10 mg by mouth every evening.   Ibuprofen-Acetaminophen (ADVIL DUAL ACTION) 125-250 MG TABS Take 2 tablets by mouth daily as needed (pain).   influenza vaccine adjuvanted (FLUAD QUADRIVALENT) 0.5 ML injection Inject into the muscle.   methocarbamol (ROBAXIN) 500 MG tablet Take 1 tablet (500 mg total) by mouth every 6 (six) hours as needed for muscle spasms.   ondansetron (ZOFRAN) 4 MG tablet Take 4 mg by mouth every 8 (eight) hours as needed for nausea or vomiting.   oxyCODONE-acetaminophen (PERCOCET) 5-325 MG tablet 1 for moderate and 2 for severe pain   PARoxetine (PAXIL-CR) 25 MG 24 hr tablet Take 25 mg by mouth daily.   Polyethyl Glycol-Propyl Glycol (SYSTANE OP) Place 1 drop into both eyes 2 (two) times daily.   RSV vaccine recomb adjuvanted (AREXVY) 120 MCG/0.5ML injection Inject into the muscle.   simvastatin (ZOCOR) 40 MG tablet Take 40 mg by mouth every evening.   sodium chloride (OCEAN) 0.65 % SOLN nasal spray Place 1 spray into both nostrils as needed for congestion.   aspirin 81 MG chewable tablet Chew 1 tablet (81 mg total) by mouth 2 (two) times daily. (Patient not taking: Reported on 09/10/2022)   meloxicam (MOBIC) 15 MG tablet Take 1 tablet by mouth once a day with food for 7 days and then as needed thereafter (Patient not taking: Reported on 05/15/2022)   prednisoLONE acetate (PRED FORTE) 1 % ophthalmic suspension Place 1 drop in the right eye 6 times daily (Patient not taking: Reported on 05/15/2022)   predniSONE (DELTASONE) 10 MG tablet Take as directed per MD instructions   No facility-administered encounter medications on  file as of 09/10/2022.    Review of Systems  Constitutional:  Negative for activity change and appetite change.  HENT: Negative.    Respiratory:  Negative for cough and shortness of breath.   Cardiovascular:  Negative for leg swelling.  Gastrointestinal:  Negative for constipation.  Genitourinary: Negative.   Musculoskeletal:  Positive for gait problem and myalgias. Negative for arthralgias.  Skin: Negative.   Neurological:  Negative for dizziness and weakness.  Psychiatric/Behavioral:  Negative for confusion, dysphoric mood and sleep disturbance.     Vitals:   09/10/22 1318  BP: (!) 150/84  Pulse: 74  Resp: 14  Temp: 97.6 F (36.4 C)  TempSrc: Temporal  SpO2: 97%  Weight: 155 lb 12.8 oz (70.7 kg)  Height: 5' 4.5" (1.638 m)   Body mass index is 26.33 kg/m. Physical Exam Vitals reviewed.  Constitutional:      Appearance: Normal appearance.  HENT:     Head: Normocephalic.     Nose: Nose normal.     Mouth/Throat:     Mouth: Mucous membranes are moist.     Pharynx: Oropharynx is clear.  Eyes:     Pupils: Pupils are equal, round, and reactive to light.  Cardiovascular:     Rate and Rhythm: Normal rate and regular rhythm.     Pulses: Normal pulses.     Heart sounds: Normal heart sounds. No murmur heard. Pulmonary:     Effort: Pulmonary effort is normal.     Breath sounds: Normal breath sounds.  Abdominal:     General: Abdomen is flat. Bowel sounds are normal.     Palpations: Abdomen is soft.  Musculoskeletal:        General: No swelling.     Cervical back: Neck supple.  Skin:    General: Skin is warm.  Neurological:     General: No focal deficit present.     Mental Status: She is alert and oriented to person, place, and time.  Psychiatric:        Mood and Affect: Mood normal.        Thought Content: Thought content normal.     Labs reviewed: Basic Metabolic Panel: Recent Labs    01/09/22 0821  NA 141  K 4.7  CL 107  CO2 27  GLUCOSE 107*  BUN 18   CREATININE 0.84  CALCIUM 9.5   Liver Function Tests: No results for input(s): "AST", "ALT", "ALKPHOS", "BILITOT", "PROT", "ALBUMIN" in the last 8760 hours. No results for input(s): "LIPASE", "AMYLASE" in the last 8760 hours. No results for input(s): "AMMONIA" in the last 8760 hours. CBC: Recent Labs    01/09/22 0821 01/19/22 0329  WBC 6.0 8.8  HGB 13.1 10.4*  HCT 41.0 32.3*  MCV 91.9 93.4  PLT 294 235   Cardiac Enzymes: No results for input(s): "CKTOTAL", "CKMB", "CKMBINDEX", "TROPONINI" in the last 8760 hours. BNP: Invalid input(s): "POCBNP" Lab Results  Component Value Date   HGBA1C 6.3 (H) 01/09/2022   No results found for: "TSH" No results found for: "VITAMINB12" No results found for: "FOLATE" No results found for: "IRON", "TIBC", "FERRITIN"  Imaging and Procedures obtained prior to SNF admission: MR Knee Right Wo Contrast  Result Date: 07/09/2022 CLINICAL DATA:  Meniscal injury, knee EXAM: MRI OF THE RIGHT KNEE WITHOUT CONTRAST TECHNIQUE: Multiplanar, multisequence MR imaging of the knee was performed. No intravenous contrast was administered. COMPARISON:  Radiograph 05/07/2022 FINDINGS: MENISCI Medial: There is complex degenerative tearing of the posterior horn and body of the medial meniscus with substance loss and medial extrusion. Lateral: Intrasubstance degeneration without definitive meniscus tear. LIGAMENTS Cruciates: ACL and PCL are intact. Collaterals: Bowing of the MCL related to the extruded meniscus. The MCL is otherwise intact. Lateral collateral ligament complex is intact. CARTILAGE Patellofemoral: High-grade cartilage loss along the medial and lateral patellar facets. Mild medial trochlear chondrosis. Medial: Full-thickness cartilage loss along the weight-bearing surfaces with intense subchondral marrow edema and hypointensity zones. Lateral: Overall mild chondrosis with shallow fissuring along the posterior weight-bearing surface. JOINT: Large size joint  effusion. POPLITEAL FOSSA: Miniscule Baker's cyst. EXTENSOR MECHANISM: Intact quadriceps tendon. Intact patellar tendon. BONES: Tricompartment osteophyte  formation. Subchondral marrow edema and cystic change along the weight-bearing surfaces of the medial compartment. No aggressive osseous lesion. Other: No focal fluid collection. IMPRESSION: Tricompartment osteoarthritis, severe in the medial compartment with full-thickness cartilage loss along the weight-bearing surfaces and adjacent subchondral marrow edema. High-grade cartilage loss of the patellar facets. Complex degenerative tearing of the posterior horn and body of the medial meniscus with substance loss and medial extrusion. Large joint effusion. Electronically Signed   By: Maurine Simmering M.D.   On: 07/09/2022 10:00    Assessment/Plan 1.s/p total right knee replacement Pain Control with Percocet Robaxin and Motrin WBAT Walking with her walker Aspirin Follow up with Ortho  2. Mixed hyperlipidemia On statin  3. Localized osteoporosis without current pathological fracture Fosamax  4. Anxiety Paxil  Discharge home on [place] therapy Follow with her PCP  Family/ staff Communication:   Labs/tests ordered:

## 2022-09-12 ENCOUNTER — Other Ambulatory Visit: Payer: Self-pay | Admitting: Adult Health

## 2022-09-12 ENCOUNTER — Other Ambulatory Visit (HOSPITAL_BASED_OUTPATIENT_CLINIC_OR_DEPARTMENT_OTHER): Payer: Self-pay

## 2022-09-12 ENCOUNTER — Telehealth: Payer: Self-pay | Admitting: *Deleted

## 2022-09-12 MED ORDER — OXYCODONE-ACETAMINOPHEN 5-325 MG PO TABS
1.0000 | ORAL_TABLET | Freq: Four times a day (QID) | ORAL | 0 refills | Status: DC | PRN
  Filled 2022-09-12: qty 30, 4d supply, fill #0

## 2022-09-12 MED ORDER — OXYCODONE-ACETAMINOPHEN 5-325 MG PO TABS
ORAL_TABLET | ORAL | 0 refills | Status: DC
  Filled 2022-09-12: qty 30, fill #0

## 2022-09-12 MED ORDER — OXYCODONE HCL 5 MG PO TABS: ORAL_TABLET | ORAL | 0 refills | Status: DC

## 2022-09-12 NOTE — Telephone Encounter (Signed)
Hailee with [facility] called and stated that they received a Rx for Oxycodone and they need to Clarify the Instructions, there is no Frequency . Needs new Rx faxed.   oxyCODONE-acetaminophen (PERCOCET) 5-325 MG tablet 30 tablet 0 09/12/2022    Sig: 1 for moderate and 2 for severe pain   Sent to pharmacy as: oxyCODONE-acetaminophen (PERCOCET) 5-325 MG tablet

## 2022-09-12 NOTE — Telephone Encounter (Signed)
Done

## 2022-09-14 ENCOUNTER — Other Ambulatory Visit (HOSPITAL_BASED_OUTPATIENT_CLINIC_OR_DEPARTMENT_OTHER): Payer: Self-pay

## 2022-09-17 ENCOUNTER — Other Ambulatory Visit (HOSPITAL_BASED_OUTPATIENT_CLINIC_OR_DEPARTMENT_OTHER): Payer: Self-pay

## 2022-09-17 MED ORDER — TRAMADOL HCL 50 MG PO TABS
50.0000 mg | ORAL_TABLET | Freq: Four times a day (QID) | ORAL | 0 refills | Status: DC | PRN
  Filled 2022-09-17: qty 20, 5d supply, fill #0

## 2022-10-11 ENCOUNTER — Other Ambulatory Visit (HOSPITAL_BASED_OUTPATIENT_CLINIC_OR_DEPARTMENT_OTHER): Payer: Self-pay

## 2022-10-11 MED ORDER — COMIRNATY 30 MCG/0.3ML IM SUSY
PREFILLED_SYRINGE | INTRAMUSCULAR | 0 refills | Status: DC
  Filled 2022-10-11: qty 0.3, 1d supply, fill #0

## 2022-11-21 ENCOUNTER — Encounter: Payer: Self-pay | Admitting: Radiology

## 2023-04-06 ENCOUNTER — Other Ambulatory Visit (HOSPITAL_BASED_OUTPATIENT_CLINIC_OR_DEPARTMENT_OTHER): Payer: Self-pay

## 2023-04-25 ENCOUNTER — Other Ambulatory Visit (HOSPITAL_BASED_OUTPATIENT_CLINIC_OR_DEPARTMENT_OTHER): Payer: Self-pay

## 2023-05-27 ENCOUNTER — Other Ambulatory Visit (HOSPITAL_BASED_OUTPATIENT_CLINIC_OR_DEPARTMENT_OTHER): Payer: Self-pay

## 2023-05-27 MED ORDER — COMIRNATY 30 MCG/0.3ML IM SUSY
0.3000 mL | PREFILLED_SYRINGE | Freq: Once | INTRAMUSCULAR | 0 refills | Status: AC
  Filled 2023-05-27: qty 0.3, 1d supply, fill #0

## 2023-06-17 ENCOUNTER — Other Ambulatory Visit (HOSPITAL_BASED_OUTPATIENT_CLINIC_OR_DEPARTMENT_OTHER): Payer: Self-pay

## 2023-06-17 MED ORDER — INFLUENZA VAC A&B SURF ANT ADJ 0.5 ML IM SUSY
0.5000 mL | PREFILLED_SYRINGE | Freq: Once | INTRAMUSCULAR | 0 refills | Status: AC
  Filled 2023-06-17: qty 0.5, 1d supply, fill #0

## 2023-06-18 ENCOUNTER — Other Ambulatory Visit (HOSPITAL_BASED_OUTPATIENT_CLINIC_OR_DEPARTMENT_OTHER): Payer: Self-pay

## 2023-06-18 MED ORDER — LORAZEPAM 0.5 MG PO TABS
0.5000 mg | ORAL_TABLET | Freq: Every day | ORAL | 0 refills | Status: AC | PRN
  Filled 2023-06-18: qty 10, 10d supply, fill #0

## 2023-06-20 ENCOUNTER — Other Ambulatory Visit (HOSPITAL_BASED_OUTPATIENT_CLINIC_OR_DEPARTMENT_OTHER): Payer: Self-pay

## 2023-06-25 ENCOUNTER — Other Ambulatory Visit (INDEPENDENT_AMBULATORY_CARE_PROVIDER_SITE_OTHER): Payer: Medicare Other

## 2023-06-25 ENCOUNTER — Encounter: Payer: Self-pay | Admitting: Orthopaedic Surgery

## 2023-06-25 ENCOUNTER — Ambulatory Visit: Payer: Medicare Other | Admitting: Orthopaedic Surgery

## 2023-06-25 ENCOUNTER — Other Ambulatory Visit (HOSPITAL_BASED_OUTPATIENT_CLINIC_OR_DEPARTMENT_OTHER): Payer: Self-pay

## 2023-06-25 VITALS — Ht 64.17 in | Wt 153.6 lb

## 2023-06-25 DIAGNOSIS — M25552 Pain in left hip: Secondary | ICD-10-CM

## 2023-06-25 MED ORDER — TRAMADOL HCL 50 MG PO TABS
50.0000 mg | ORAL_TABLET | Freq: Four times a day (QID) | ORAL | 0 refills | Status: DC | PRN
Start: 2023-06-25 — End: 2023-07-11
  Filled 2023-06-25: qty 28, 7d supply, fill #0

## 2023-06-25 NOTE — Progress Notes (Signed)
Office Visit Note   Patient: Nichole [name]           Date of Birth: Dec 27, 1945           MRN: [id number] Visit Date: 06/25/2023              Requested by: No referring provider defined for this encounter. PCP: Nichole Laughter, MD (Inactive)   Assessment & Plan: Visit Diagnoses:  1. Pain in left hip     Plan:  Given her severe pain in the left hip and normal radiographs of the left hip.  Recommend MRI left hip to evaluate cartilage also rule out acute fracture.  She will follow-up with [place] after the MRI to go over results and discuss further treatment.  Continue to use a cane and the interim.  Questions encouraged and answered by Dr. Magnus [name] myself.  Follow-Up Instructions: No follow-ups on file.   Orders:  Orders Placed This Encounter  Procedures   XR HIP UNILAT W OR W/O PELVIS 2-3 VIEWS LEFT   MR Hip Left w/o contrast   Meds ordered this encounter  Medications   traMADol (ULTRAM) 50 MG tablet    Sig: Take 1 tablet (50 mg total) by mouth every 6 (six) hours as needed.    Dispense:  30 tablet    Refill:  0      Procedures: No procedures performed   Clinical Data: No additional findings.   Subjective: Chief Complaint  Patient presents with   Left Hip - Pain    HPI Nichole [name] comes in today due to left hip pain.  History of right total hip arthroplasty 01/18/2022 by Dr. Magnus [name].  Right hip overall doing well.  She has had pain left hip and groin region since September 1.  No known injury.  She is having to use a cane to ambulate over the last 10 days due to increasing hip pain.  It is awaken her.  She denies any injury.  Denies any radicular symptoms down the leg.  She denies any bowel or bladder dysfunction or weight loss.  She does note that she started back to yoga in August and so is not sure if this is related.  She reports that she had a reaction to steroid 1 year ago was given by another physician for different medical problems and her hip and that she had nausea  with vomiting with this.  History of pelvis MRI 09/06/2021 which showed severe tendinosis of the left proximal hamstring tendons with high-grade partial tear.  Moderate to severe tendinosis of the proximal right hamstring tendons with partial tearing.  Severe degenerative changes of the right hip.  Review of Systems Negative for fevers chills.  Objective: Vital Signs: Ht 5' 4.17" (1.63 m)   Wt 153 lb 9.6 oz (69.7 kg)   BMI 26.22 kg/m   Physical Exam Constitutional:      Appearance: She is not ill-appearing.  Neurological:     Mental Status: She is alert and oriented to person, place, and time.  Psychiatric:        Mood and Affect: Mood normal.     Ortho Exam Bilateral hips excellent range of motion of both hips.  Extremes of internal/external rotation left hip causes pain.  This is comfortable with left hip flexion against resistance.  5 out of 5 strength throughout the lower extremities bilaterally.  Negative straight leg raise bilaterally.  No tenderness to either IT band.  Minimal left trochanteric tenderness.  Ambulates with  an antalgic gait and the use of a cane.  Hamstrings are not significantly tight and stretching the hamstrings causes no pain bilaterally.  Specialty Comments:  No specialty comments available.  Imaging: XR HIP UNILAT W OR W/O PELVIS 2-3 VIEWS LEFT  Result Date: 06/25/2023 AP pelvis lateral view of the left hip: No acute fractures.  Both hips well located.  Status post right total hip arthroplasty well-seated components.  No acute findings.    PMFS History: Patient Active Problem List   Diagnosis Date Noted   Arthritis of knee, right 04/25/2022   Pain in right knee 04/15/2022   Status post total replacement of right hip 01/18/2022   Unilateral primary osteoarthritis, right hip 12/24/2021   Sensorineural hearing loss (SNHL), bilateral 07/28/2019   Temporomandibular jaw dysfunction 07/28/2019   Past Medical History:  Diagnosis Date   Anxiety     Arthritis    HDL deficiency    Pre-diabetes    Diet controlled    No family history on file.  Past Surgical History:  Procedure Laterality Date   COLONOSCOPY     EYE SURGERY Bilateral 2018   cateract   SHOULDER ARTHROSCOPY Right 1997   TONSILLECTOMY     age 89   TOTAL HIP ARTHROPLASTY Right 01/18/2022   Procedure: RIGHT TOTAL HIP ARTHROPLASTY ANTERIOR APPROACH;  Surgeon: Kathryne Hitch, MD;  Location: [facility];  Service: Orthopedics;  Laterality: Right;   Social History   Occupational History   Not on file  Tobacco Use   Smoking status: Never   Smokeless tobacco: Never  Vaping Use   Vaping status: Never Used  Substance and Sexual Activity   Alcohol use: Yes    Alcohol/week: 14.0 standard drinks of alcohol    Types: 14 Glasses of wine per week   Drug use: Never   Sexual activity: Not on file

## 2023-06-26 ENCOUNTER — Other Ambulatory Visit (HOSPITAL_BASED_OUTPATIENT_CLINIC_OR_DEPARTMENT_OTHER): Payer: Self-pay

## 2023-06-26 MED ORDER — ALENDRONATE SODIUM 70 MG PO TABS
70.0000 mg | ORAL_TABLET | ORAL | 11 refills | Status: DC
  Filled 2023-06-26: qty 4, 28d supply, fill #0
  Filled 2023-07-01: qty 12, 84d supply, fill #0
  Filled 2023-09-18: qty 12, 84d supply, fill #1
  Filled 2023-12-11: qty 12, 84d supply, fill #2
  Filled 2024-03-04: qty 12, 84d supply, fill #3

## 2023-07-01 ENCOUNTER — Other Ambulatory Visit (HOSPITAL_BASED_OUTPATIENT_CLINIC_OR_DEPARTMENT_OTHER): Payer: Self-pay

## 2023-07-02 ENCOUNTER — Ambulatory Visit
Admission: RE | Admit: 2023-07-02 | Discharge: 2023-07-02 | Disposition: A | Discharge status: Home / Self Care | Payer: Medicare Other | Source: Ambulatory Visit | Attending: Physician Assistant | Admitting: Physician Assistant

## 2023-07-02 DIAGNOSIS — M25552 Pain in left hip: Secondary | ICD-10-CM

## 2023-07-04 ENCOUNTER — Telehealth: Payer: Self-pay | Admitting: Physician Assistant

## 2023-07-04 ENCOUNTER — Telehealth: Payer: Self-pay

## 2023-07-04 NOTE — Telephone Encounter (Signed)
Patient called advised her MRI was received back and she want to know if Bronson Curb can speak with her concerning the results of her MRI?  Patient has an appointment coming up but she's concerned about it. The number to contact patient is [phone]

## 2023-07-04 NOTE — Telephone Encounter (Signed)
Nichole [name] with [facility] wanted to let Nichole [name] know about MRI results of Left Hip.  Please advise.  Thank you.

## 2023-07-07 ENCOUNTER — Other Ambulatory Visit (HOSPITAL_BASED_OUTPATIENT_CLINIC_OR_DEPARTMENT_OTHER): Payer: Self-pay

## 2023-07-07 ENCOUNTER — Encounter: Payer: Self-pay | Admitting: Orthopaedic Surgery

## 2023-07-07 ENCOUNTER — Ambulatory Visit: Payer: Medicare Other | Admitting: Orthopaedic Surgery

## 2023-07-07 DIAGNOSIS — M1612 Unilateral primary osteoarthritis, left hip: Secondary | ICD-10-CM | POA: Diagnosis not present

## 2023-07-07 MED ORDER — HYDROCODONE-ACETAMINOPHEN 5-325 MG PO TABS
1.0000 | ORAL_TABLET | Freq: Four times a day (QID) | ORAL | 0 refills | Status: DC | PRN
  Filled 2023-07-07: qty 30, 8d supply, fill #0

## 2023-07-07 NOTE — Progress Notes (Signed)
The patient is a 77 year old female who comes in to go over MRI of her left hip.  We replaced her right hip in May 2023.  She has developed debilitating left hip pain that has become quite significant at this point.  She is needing to ambulate with a rolling walker to offload that left hip.  Her plain films showed a well-maintained joint space with just a little bit of irregular findings of the femoral head.  Her right hip moves smoothly and fluidly.  The left hip is quite painful with any attempts of motion.  MRI of her left hip shows significant subchondral edema altered of the femoral head into the femoral neck.  There is a line just below the articular surface suggesting an insufficiency fracture as well.  This is quite a severe issue and physical therapy will not even help this.  I would not recommend a steroid injection.  She needs a hip replacement on this left side given the severity of that femoral head and the edema into the femoral neck.  This is concerning that she could develop worsening problems so we need to move her up in the order of things in terms of proceeding with a left hip replacement as soon as we can.  I will send in some hydrocodone for pain in the interim and I want her to move slow and still use the walker.  Will work on getting her scheduled in the near future for a left total hip arthroplasty.

## 2023-07-07 NOTE — Telephone Encounter (Signed)
Patient has MRI review appointment this morning. (07/07/23)

## 2023-07-09 ENCOUNTER — Other Ambulatory Visit (HOSPITAL_COMMUNITY): Payer: Medicare Other

## 2023-07-09 ENCOUNTER — Other Ambulatory Visit: Payer: Self-pay

## 2023-07-09 NOTE — Patient Instructions (Signed)
SURGICAL WAITING ROOM VISITATION Patients having surgery or a procedure may have no more than 2 support people in the waiting area - these visitors may rotate.    Children under the age of 59 must have an adult with them who is not the patient.  If the patient needs to stay at the hospital during part of their recovery, the visitor guidelines for inpatient rooms apply. Pre-op nurse will coordinate an appropriate time for 1 support person to accompany patient in pre-op.  This support person may not rotate.    Please refer to the [facility] website for the visitor guidelines for Inpatients (after your surgery is over and you are in a regular room).       Your procedure is scheduled on: 07-11-23   Report to [facility] Main Entrance    Report to admitting at 6:15 AM   Call this number if you have problems the morning of surgery [phone]   Do not eat food :After Midnight.   After Midnight you may have the following liquids until 5:45 AM DAY OF SURGERY  Water Non-Citrus Juices (without pulp, NO RED-Apple, White grape, White cranberry) Black Coffee (NO MILK/CREAM OR CREAMERS, sugar ok)  Clear Tea (NO MILK/CREAM OR CREAMERS, sugar ok) regular and decaf                             Plain Jell-O (NO RED)                                           Fruit ices (not with fruit pulp, NO RED)                                     Popsicles (NO RED)                                                               Sports drinks like Gatorade (NO RED)                   The day of surgery:  Drink ONE (1) Pre-Surgery G2 by 5:45 AM the morning of surgery. Drink in one sitting. Do not sip.  This drink was given to you during your hospital  pre-op appointment visit. Nothing else to drink after completing the Pre-Surgery G2.          If you have questions, please contact your surgeon's office.   FOLLOW  ANY ADDITIONAL PRE OP INSTRUCTIONS YOU RECEIVED FROM YOUR SURGEON'S OFFICE!!!      Oral Hygiene is also important to reduce your risk of infection.                                    Remember - BRUSH YOUR TEETH THE MORNING OF SURGERY WITH YOUR REGULAR TOOTHPASTE   Take these medicines the morning of surgery with A SIP OF WATER:   Lorazepam  Paroxetine  If needed Tramadol or Hydrocodone  Stop all vitamins and  herbal supplements 7 days before surgery             You may not have any metal on your body including hair pins, jewelry, and body piercing             Do not wear make-up, lotions, powders, perfumes, or deodorant  Do not wear nail polish including gel and S&S, artificial/acrylic nails, or any other type of covering on natural nails including finger and toenails. If you have artificial nails, gel coating, etc. that needs to be removed by a nail salon please have this removed prior to surgery or surgery may need to be canceled/ delayed if the surgeon/ anesthesia feels like they are unable to be safely monitored.   Do not shave  48 hours prior to surgery.       Do not bring valuables to the hospital. [name] IS NOT RESPONSIBLE   FOR VALUABLES.   Contacts, dentures or bridgework may not be worn into surgery.   Bring small overnight bag day of surgery.   DO NOT BRING YOUR HOME MEDICATIONS TO THE HOSPITAL. PHARMACY WILL DISPENSE MEDICATIONS LISTED ON YOUR MEDICATION LIST TO YOU DURING YOUR ADMISSION IN THE HOSPITAL!   Special Instructions: Bring a copy of your healthcare power of attorney and living will documents the day of surgery if you haven't scanned them before.              Please read over the following fact sheets you were given: IF YOU HAVE QUESTIONS ABOUT YOUR PRE-OP INSTRUCTIONS PLEASE CALL [phone] Nichole [name]  If you received a COVID test during your pre-op visit  it is requested that you wear a mask when out in public, stay away from anyone that may not be feeling well and notify your surgeon if you develop symptoms. If you test positive for Covid  or have been in contact with anyone that has tested positive in the last 10 days please notify you surgeon.    Pre-operative 5 CHG [name] Instructions   You can play a key role in reducing the risk of infection after surgery. Your skin needs to be as free of germs as possible. You can reduce the number of germs on your skin by washing with CHG (chlorhexidine gluconate) soap before surgery. CHG is an antiseptic soap that kills germs and continues to kill germs even after washing.   DO NOT use if you have an allergy to chlorhexidine/CHG or antibacterial soaps. If your skin becomes reddened or irritated, stop using the CHG and notify one of our RNs at [phone].   Please shower with the CHG soap starting 4 days before surgery using the following schedule:     Please keep in mind the following:  DO NOT shave, including legs and underarms, starting the day of your first shower.   You may shave your face at any point before/day of surgery.  Place clean sheets on your bed the day you start using CHG soap. Use a clean washcloth (not used since being washed) for each shower. DO NOT sleep with pets once you start using the CHG.   CHG Shower Instructions:  If you choose to wash your hair and private area, wash first with your normal shampoo/soap.  After you use shampoo/soap, rinse your hair and body thoroughly to remove shampoo/soap residue.  Turn the water OFF and apply about 3 tablespoons (45 ml) of CHG soap to a CLEAN washcloth.  Apply CHG soap ONLY FROM YOUR NECK DOWN TO  YOUR TOES (washing for 3-5 minutes)  DO NOT use CHG soap on face, private areas, open wounds, or sores.  Pay special attention to the area where your surgery is being performed.  If you are having back surgery, having someone wash your back for you may be helpful. Wait 2 minutes after CHG soap is applied, then you may rinse off the CHG soap.  Pat dry with a clean towel  Put on clean clothes/pajamas   If you choose to wear  lotion, please use ONLY the CHG-compatible lotions on the back of this paper.     Additional instructions for the day of surgery: DO NOT APPLY any lotions, deodorants, cologne, or perfumes.   Put on clean/comfortable clothes.  Brush your teeth.  Ask your nurse before applying any prescription medications to the skin.      CHG Compatible Lotions   Aveeno Moisturizing lotion  Cetaphil Moisturizing Cream  Cetaphil Moisturizing Lotion  Clairol Herbal Essence Moisturizing Lotion, Dry Skin  Clairol Herbal Essence Moisturizing Lotion, Extra Dry Skin  Clairol Herbal Essence Moisturizing Lotion, Normal Skin  Curel Age Defying Therapeutic Moisturizing Lotion with Alpha Hydroxy  Curel Extreme Care Body Lotion  Curel Soothing Hands Moisturizing Hand Lotion  Curel Therapeutic Moisturizing Cream, Fragrance-Free  Curel Therapeutic Moisturizing Lotion, Fragrance-Free  Curel Therapeutic Moisturizing Lotion, Original Formula  Eucerin Daily Replenishing Lotion  Eucerin Dry Skin Therapy Plus Alpha Hydroxy Crme  Eucerin Dry Skin Therapy Plus Alpha Hydroxy Lotion  Eucerin Original Crme  Eucerin Original Lotion  Eucerin Plus Crme Eucerin Plus Lotion  Eucerin TriLipid Replenishing Lotion  Keri Anti-Bacterial Hand Lotion  Keri Deep Conditioning Original Lotion Dry Skin Formula Softly Scented  Keri Deep Conditioning Original Lotion, Fragrance Free Sensitive Skin Formula  Keri Lotion Fast Absorbing Fragrance Free Sensitive Skin Formula  Keri Lotion Fast Absorbing Softly Scented Dry Skin Formula  Keri Original Lotion  Keri Skin Renewal Lotion Keri Silky Smooth Lotion  Keri Silky Smooth Sensitive Skin Lotion  Nivea Body Creamy Conditioning Oil  Nivea Body Extra Enriched Lotion  Nivea Body Original Lotion  Nivea Body Sheer Moisturizing Lotion Nivea Crme  Nivea Skin Firming Lotion  NutraDerm 30 Skin Lotion  NutraDerm Skin Lotion  NutraDerm Therapeutic Skin Cream  NutraDerm Therapeutic Skin  Lotion  ProShield Protective Hand Cream  Provon moisturizing lotion   Incentive Spirometer  An incentive spirometer is a tool that can help keep your lungs clear and active. This tool measures how well you are filling your lungs with each breath. Taking long deep breaths may help reverse or decrease the chance of developing breathing (pulmonary) problems (especially infection) following: A long period of time when you are unable to move or be active. BEFORE THE PROCEDURE  If the spirometer includes an indicator to show your best effort, your nurse or respiratory therapist will set it to a desired goal. If possible, sit up straight or lean slightly forward. Try not to slouch. Hold the incentive spirometer in an upright position. INSTRUCTIONS FOR USE  Sit on the edge of your bed if possible, or sit up as far as you can in bed or on a chair. Hold the incentive spirometer in an upright position. Breathe out normally. Place the mouthpiece in your mouth and seal your lips tightly around it. Breathe in slowly and as deeply as possible, raising the piston or the ball toward the top of the column. Hold your breath for 3-5 seconds or for as long as possible. Allow the piston or ball  to fall to the bottom of the column. Remove the mouthpiece from your mouth and breathe out normally. Rest for a few seconds and repeat Steps 1 through 7 at least 10 times every 1-2 hours when you are awake. Take your time and take a few normal breaths between deep breaths. The spirometer may include an indicator to show your best effort. Use the indicator as a goal to work toward during each repetition. After each set of 10 deep breaths, practice coughing to be sure your lungs are clear. If you have an incision (the cut made at the time of surgery), support your incision when coughing by placing a pillow or rolled up towels firmly against it. Once you are able to get out of bed, walk around indoors and cough well. You may  stop using the incentive spirometer when instructed by your caregiver.  RISKS AND COMPLICATIONS Take your time so you do not get dizzy or light-headed. If you are in pain, you may need to take or ask for pain medication before doing incentive spirometry. It is harder to take a deep breath if you are having pain. AFTER USE Rest and breathe slowly and easily. It can be helpful to keep track of a log of your progress. Your caregiver can provide you with a simple table to help with this. If you are using the spirometer at home, follow these instructions: SEEK MEDICAL CARE IF:  You are having difficultly using the spirometer. You have trouble using the spirometer as often as instructed. Your pain medication is not giving enough relief while using the spirometer. You develop fever of 100.5 F (38.1 C) or higher. SEEK IMMEDIATE MEDICAL CARE IF:  You cough up bloody sputum that had not been present before. You develop fever of 102 F (38.9 C) or greater. You develop worsening pain at or near the incision site. MAKE SURE YOU:  Understand these instructions. Will watch your condition. Will get help right away if you are not doing well or get worse. Document Released: 01/13/2007 Document Revised: 11/25/2011 Document Reviewed: 03/16/2007 Exit[employer] Patient Information 2014 [employer], [place].   ________________________________________________________________________ WHAT IS A BLOOD TRANSFUSION? Blood Transfusion Information  A transfusion is the replacement of blood or some of its parts. Blood is made up of multiple cells which provide different functions. Red blood cells carry oxygen and are used for blood loss replacement. White blood cells fight against infection. Platelets control bleeding. Plasma helps clot blood. Other blood products are available for specialized needs, such as hemophilia or other clotting disorders. BEFORE THE TRANSFUSION  Who gives blood for transfusions?  Healthy  volunteers who are fully evaluated to make sure their blood is safe. This is blood bank blood. Transfusion therapy is the safest it has ever been in the practice of medicine. Before blood is taken from a donor, a complete history is taken to make sure that person has no history of diseases nor engages in risky social behavior (examples are intravenous drug use or sexual activity with multiple partners). The donor's travel history is screened to minimize risk of transmitting infections, such as malaria. The donated blood is tested for signs of infectious diseases, such as HIV and hepatitis. The blood is then tested to be sure it is compatible with you in order to minimize the chance of a transfusion reaction. If you or a relative donates blood, this is often done in anticipation of surgery and is not appropriate for emergency situations. It takes many days to process the  donated blood. RISKS AND COMPLICATIONS Although transfusion therapy is very safe and saves many lives, the main dangers of transfusion include:  Getting an infectious disease. Developing a transfusion reaction. This is an allergic reaction to something in the blood you were given. Every precaution is taken to prevent this. The decision to have a blood transfusion has been considered carefully by your caregiver before blood is given. Blood is not given unless the benefits outweigh the risks. AFTER THE TRANSFUSION Right after receiving a blood transfusion, you will usually feel much better and more energetic. This is especially true if your red blood cells have gotten low (anemic). The transfusion raises the level of the red blood cells which carry oxygen, and this usually causes an energy increase. The nurse administering the transfusion will monitor you carefully for complications. HOME CARE INSTRUCTIONS  No special instructions are needed after a transfusion. You may find your energy is better. Speak with your caregiver about any  limitations on activity for underlying diseases you may have. SEEK MEDICAL CARE IF:  Your condition is not improving after your transfusion. You develop redness or irritation at the intravenous (IV) site. SEEK IMMEDIATE MEDICAL CARE IF:  Any of the following symptoms occur over the next 12 hours: Shaking chills. You have a temperature by mouth above 102 F (38.9 C), not controlled by medicine. Chest, back, or muscle pain. People around you feel you are not acting correctly or are confused. Shortness of breath or difficulty breathing. Dizziness and fainting. You get a rash or develop hives. You have a decrease in urine output. Your urine turns a dark color or changes to pink, red, or brown. Any of the following symptoms occur over the next 10 days: You have a temperature by mouth above 102 F (38.9 C), not controlled by medicine. Shortness of breath. Weakness after normal activity. The white part of the eye turns yellow (jaundice). You have a decrease in the amount of urine or are urinating less often. Your urine turns a dark color or changes to pink, red, or brown. Document Released: 08/30/2000 Document Revised: 11/25/2011 Document Reviewed: 04/18/2008 [facility] Patient Information 2014 [place], [place].

## 2023-07-10 ENCOUNTER — Encounter (HOSPITAL_COMMUNITY): Payer: Self-pay

## 2023-07-10 ENCOUNTER — Other Ambulatory Visit: Payer: Self-pay

## 2023-07-10 ENCOUNTER — Encounter (HOSPITAL_COMMUNITY)
Admission: RE | Admit: 2023-07-10 | Discharge: 2023-07-10 | Disposition: A | Discharge status: Home / Self Care | Payer: Medicare Other | Source: Ambulatory Visit | Attending: Orthopaedic Surgery | Admitting: Orthopaedic Surgery

## 2023-07-10 DIAGNOSIS — Z01818 Encounter for other preprocedural examination: Secondary | ICD-10-CM

## 2023-07-10 DIAGNOSIS — M1612 Unilateral primary osteoarthritis, left hip: Secondary | ICD-10-CM | POA: Diagnosis not present

## 2023-07-10 DIAGNOSIS — Z01812 Encounter for preprocedural laboratory examination: Secondary | ICD-10-CM | POA: Insufficient documentation

## 2023-07-10 HISTORY — DX: Malignant (primary) neoplasm, unspecified: C80.1

## 2023-07-10 LAB — CBC
HCT: 39.4 % (ref 36.0–46.0)
Hemoglobin: 12.7 g/dL (ref 12.0–15.0)
MCH: 29.9 pg (ref 26.0–34.0)
MCHC: 32.2 g/dL (ref 30.0–36.0)
MCV: 92.7 fL (ref 80.0–100.0)
Platelets: 335 10*3/uL (ref 150–400)
RBC: 4.25 MIL/uL (ref 3.87–5.11)
RDW: 12.7 % (ref 11.5–15.5)
WBC: 10 10*3/uL (ref 4.0–10.5)
nRBC: 0 % (ref 0.0–0.2)

## 2023-07-10 LAB — SURGICAL PCR SCREEN
MRSA, PCR: NEGATIVE
Staphylococcus aureus: NEGATIVE

## 2023-07-10 LAB — COMPREHENSIVE METABOLIC PANEL
ALT: 23 U/L (ref 0–44)
AST: 23 U/L (ref 15–41)
Albumin: 4.3 g/dL (ref 3.5–5.0)
Alkaline Phosphatase: 79 U/L (ref 38–126)
Anion gap: 9 (ref 5–15)
BUN: 26 mg/dL — ABNORMAL HIGH (ref 8–23)
CO2: 25 mmol/L (ref 22–32)
Calcium: 9.4 mg/dL (ref 8.9–10.3)
Chloride: 102 mmol/L (ref 98–111)
Creatinine, Ser: 0.92 mg/dL (ref 0.44–1.00)
GFR, Estimated: >60 mL/min (ref >60)
Glucose, Bld: 102 mg/dL — ABNORMAL HIGH (ref 70–99)
Potassium: 4.7 mmol/L (ref 3.5–5.1)
Sodium: 136 mmol/L (ref 135–145)
Total Bilirubin: 0.7 mg/dL (ref 0.3–1.2)
Total Protein: 7.7 g/dL (ref 6.5–8.1)

## 2023-07-10 NOTE — Progress Notes (Addendum)
COVID Vaccine received:  []  No [x]  Yes Date of any COVID positive Test in last 90 days: no PCP - Hillard Danker MD Cardiologist - no  Chest x-ray -  EKG -   Stress Test -  ECHO -  Cardiac Cath -   Bowel Prep - [x]  No  []   Yes ______  Pacemaker / ICD device [x]  No []  Yes   Spinal Cord Stimulator:[x]  No []  Yes       History of Sleep Apnea? [x]  No []  Yes   CPAP used?- [x]  No []  Yes    Does the patient monitor blood sugar?          [x]  No []  Yes  []  N/A  Patient has: []  NO Hx DM   [x]  Pre-DM                 []  DM1  []   DM2 Does patient have a [employer] or Dexacom? []  No []  Yes   Fasting Blood Sugar Ranges-  Checks Blood Sugar _____ times a day  GLP1 agonist / usual dose - no GLP1 instructions:  SGLT-2 inhibitors / usual dose - no SGLT-2 instructions:   Blood Thinner / Instructions:no Aspirin Instructions:no  Comments:   Activity level: Patient is  unable to climb a flight of stairs without difficulty; [x]  No CP  [x]  No SOB, but would have __Hip pain_   Patient can perform ADLs without assistance.   Anesthesia review:   Patient denies shortness of breath, fever, cough and chest pain at PAT appointment.  Patient verbalized understanding and agreement to the Pre-Surgical Instructions that were given to them at this PAT appointment. Patient was also educated of the need to review these PAT instructions again prior to his/her surgery.I reviewed the appropriate phone numbers to call if they have any and questions or concerns.

## 2023-07-10 NOTE — H&P (Signed)
TOTAL HIP ADMISSION H&P  Patient is admitted for left total hip arthroplasty.  Subjective:  Chief Complaint: left hip pain  HPI: Nichole [name], 77 y.o. female, has a history of pain and functional disability in the left hip(s) due to arthritis and patient has failed non-surgical conservative treatments for greater than 12 weeks to include NSAID's and/or analgesics, use of assistive devices, and activity modification.  Onset of symptoms was abrupt starting  7 weeks ago with rapidlly worsening course since that time.The patient noted no past surgery on the left hip(s).  Patient currently rates pain in the left hip at 10 out of 10 with activity. Patient has night pain, worsening of pain with activity and weight bearing, pain that interfers with activities of daily living, and pain with passive range of motion. Patient has evidence of  severe subchondral edema and insufficiency fracture of the left hip  by imaging studies. This condition presents safety issues increasing the risk of falls.  There is no current active infection.  Patient Active Problem List   Diagnosis Date Noted   Unilateral primary osteoarthritis, left hip 07/07/2023   Arthritis of knee, right 04/25/2022   Pain in right knee 04/15/2022   Status post total replacement of right hip 01/18/2022   Unilateral primary osteoarthritis, right hip 12/24/2021   Sensorineural hearing loss (SNHL), bilateral 07/28/2019   Temporomandibular jaw dysfunction 07/28/2019   Past Medical History:  Diagnosis Date   Anxiety    Arthritis    Cancer (HCC)    melanoma   HDL deficiency    Pre-diabetes    Diet controlled    Past Surgical History:  Procedure Laterality Date   COLONOSCOPY     EYE SURGERY Bilateral 2018   cateract   KNEE ARTHROPLASTY Right 08/2022   SHOULDER ARTHROSCOPY Right 1997   TONSILLECTOMY     age 89   TOTAL HIP ARTHROPLASTY Right 01/18/2022   Procedure: RIGHT TOTAL HIP ARTHROPLASTY ANTERIOR APPROACH;  Surgeon: Kathryne Hitch, MD;  Location: [facility];  Service: Orthopedics;  Laterality: Right;    No current facility-administered medications for this encounter.   Current Outpatient Medications  Medication Sig Dispense Refill Last Dose   alendronate (FOSAMAX) 70 MG tablet Take 1 tablet (70 mg total) by mouth once a week. Take 30 minutes before the first food, beverage or medicine of the day with plain water 4 tablet 11    Cholecalciferol (VITAMIN D) 50 MCG (2000 UT) tablet Take 2,000 Units by mouth daily.      ezetimibe (ZETIA) 10 MG tablet Take 10 mg by mouth every evening.      HYDROcodone-acetaminophen (NORCO/VICODIN) 5-325 MG tablet Take 1 tablet by mouth every 6 (six) hours as needed for moderate pain (pain score 4-6). 30 tablet 0    Ibuprofen-Acetaminophen (ADVIL DUAL ACTION) 125-250 MG TABS Take 2 tablets by mouth daily as needed (pain).      LORazepam (ATIVAN) 0.5 MG tablet Take 1 tablet (0.5 mg total) by mouth daily as needed. 10 tablet 0    PARoxetine (PAXIL-CR) 25 MG 24 hr tablet Take 25 mg by mouth daily.      simvastatin (ZOCOR) 40 MG tablet Take 40 mg by mouth every evening.      traMADol (ULTRAM) 50 MG tablet Take 1 tablet (50 mg total) by mouth every 6 (six) hours as needed. 30 tablet 0    Allergies  Allergen Reactions   Corticosteroids Nausea And Vomiting    **ALLERGY IS TO STEROID INJECTIONS  ONLY**    Social History   Tobacco Use   Smoking status: Never   Smokeless tobacco: Never  Substance Use Topics   Alcohol use: Yes    Alcohol/week: 14.0 standard drinks of alcohol    Types: 14 Glasses of wine per week    No family history on file.   Review of Systems  Objective:  Physical Exam Vitals reviewed.  Constitutional:      Appearance: Normal appearance. She is normal weight.  HENT:     Head: Normocephalic and atraumatic.  Eyes:     Extraocular Movements: Extraocular movements intact.     Pupils: Pupils are equal, round, and reactive to light.  Cardiovascular:     Rate  and Rhythm: Normal rate.     Pulses: Normal pulses.  Pulmonary:     Effort: Pulmonary effort is normal.     Breath sounds: Normal breath sounds.  Abdominal:     Palpations: Abdomen is soft.  Musculoskeletal:     Cervical back: Normal range of motion and neck supple.     Left hip: Tenderness and bony tenderness present. Decreased range of motion. Decreased strength.  Neurological:     Mental Status: She is alert and oriented to person, place, and time.  Psychiatric:        Behavior: Behavior normal.     Vital signs in last 24 hours: Temp:  [98.5 F (36.9 C)] 98.5 F (36.9 C) (10/24 1358) Pulse Rate:  [75] 75 (10/24 1358) Resp:  [16] 16 (10/24 1358) BP: (128)/(82) 128/82 (10/24 1358) SpO2:  [100 %] 100 % (10/24 1358) Weight:  [68 kg] 68 kg (10/24 1358)  Labs:   Estimated body mass index is 25.75 kg/m as calculated from the following:   Height as of 07/10/23: 5\' 4"  (1.626 m).   Weight as of 07/10/23: 68 kg.   Imaging Review MRI demonstrates severe degenerative joint disease of the left hip(s). The bone quality appears to be good for age and reported activity level.      Assessment/Plan:  End stage arthritis, left hip(s)  The patient history, physical examination, clinical judgement of the provider and imaging studies are consistent with end stage degenerative joint disease of the left hip(s) and total hip arthroplasty is deemed medically necessary. The treatment options including medical management, injection therapy, arthroscopy and arthroplasty were discussed at length. The risks and benefits of total hip arthroplasty were presented and reviewed. The risks due to aseptic loosening, infection, stiffness, dislocation/subluxation,  thromboembolic complications and other imponderables were discussed.  The patient acknowledged the explanation, agreed to proceed with the plan and consent was signed. Patient is being admitted for inpatient treatment for surgery, pain control,  PT, OT, prophylactic antibiotics, VTE prophylaxis, progressive ambulation and ADL's and discharge planning.The patient is planning to be discharged home with home health services

## 2023-07-11 ENCOUNTER — Observation Stay (HOSPITAL_COMMUNITY): Payer: Medicare Other

## 2023-07-11 ENCOUNTER — Ambulatory Visit (HOSPITAL_COMMUNITY): Payer: Medicare Other

## 2023-07-11 ENCOUNTER — Ambulatory Visit (HOSPITAL_COMMUNITY): Payer: Medicare Other | Admitting: Anesthesiology

## 2023-07-11 ENCOUNTER — Encounter (HOSPITAL_COMMUNITY)
Admission: RE | Disposition: A | Discharge status: Skilled Nursing Facility | Payer: Self-pay | Source: Home / Self Care | Attending: Orthopaedic Surgery

## 2023-07-11 ENCOUNTER — Observation Stay (HOSPITAL_COMMUNITY)
Admission: RE | Admit: 2023-07-11 | Discharge: 2023-07-12 | Disposition: A | Discharge status: Skilled Nursing Facility | Payer: Medicare Other | Attending: Orthopaedic Surgery | Admitting: Orthopaedic Surgery

## 2023-07-11 ENCOUNTER — Encounter (HOSPITAL_COMMUNITY): Payer: Self-pay | Admitting: Orthopaedic Surgery

## 2023-07-11 ENCOUNTER — Other Ambulatory Visit: Payer: Self-pay

## 2023-07-11 DIAGNOSIS — M1612 Unilateral primary osteoarthritis, left hip: Secondary | ICD-10-CM

## 2023-07-11 DIAGNOSIS — M84459A Pathological fracture, hip, unspecified, initial encounter for fracture: Secondary | ICD-10-CM | POA: Diagnosis not present

## 2023-07-11 DIAGNOSIS — Z96641 Presence of right artificial hip joint: Secondary | ICD-10-CM | POA: Diagnosis not present

## 2023-07-11 DIAGNOSIS — Z85828 Personal history of other malignant neoplasm of skin: Secondary | ICD-10-CM | POA: Insufficient documentation

## 2023-07-11 DIAGNOSIS — Z96642 Presence of left artificial hip joint: Secondary | ICD-10-CM

## 2023-07-11 DIAGNOSIS — Z01818 Encounter for other preprocedural examination: Secondary | ICD-10-CM

## 2023-07-11 HISTORY — PX: TOTAL HIP ARTHROPLASTY: SHX124

## 2023-07-11 LAB — TYPE AND SCREEN
ABO/RH(D): O POS
Antibody Screen: NEGATIVE

## 2023-07-11 SURGERY — ARTHROPLASTY, HIP, TOTAL, ANTERIOR APPROACH
Anesthesia: Spinal | Site: Hip | Laterality: Left

## 2023-07-11 MED ORDER — PANTOPRAZOLE SODIUM 40 MG PO TBEC
40.0000 mg | DELAYED_RELEASE_TABLET | Freq: Every day | ORAL | Status: DC
  Administered 2023-07-11 – 2023-07-12 (×2): 40 mg via ORAL
  Filled 2023-07-11 (×2): qty 1

## 2023-07-11 MED ORDER — DOCUSATE SODIUM 100 MG PO CAPS
100.0000 mg | ORAL_CAPSULE | Freq: Two times a day (BID) | ORAL | Status: DC
  Administered 2023-07-11 – 2023-07-12 (×2): 100 mg via ORAL
  Filled 2023-07-11 (×2): qty 1

## 2023-07-11 MED ORDER — MIDAZOLAM HCL 5 MG/5ML IJ SOLN
INTRAMUSCULAR | Status: DC | PRN
  Administered 2023-07-11: 1 mg via INTRAVENOUS

## 2023-07-11 MED ORDER — DIPHENHYDRAMINE HCL 12.5 MG/5ML PO ELIX: 12.5000 mg | ORAL_SOLUTION | ORAL | Status: DC | PRN

## 2023-07-11 MED ORDER — EZETIMIBE 10 MG PO TABS
10.0000 mg | ORAL_TABLET | Freq: Every evening | ORAL | Status: DC
  Administered 2023-07-11: 10 mg via ORAL
  Filled 2023-07-11: qty 1

## 2023-07-11 MED ORDER — PROPOFOL 500 MG/50ML IV EMUL
INTRAVENOUS | Status: DC | PRN
  Administered 2023-07-11: 50 ug/kg/min via INTRAVENOUS
  Administered 2023-07-11: 20 mg via INTRAVENOUS

## 2023-07-11 MED ORDER — MIDAZOLAM HCL 2 MG/2ML IJ SOLN
INTRAMUSCULAR | Status: AC
  Filled 2023-07-11: qty 2

## 2023-07-11 MED ORDER — VITAMIN D3 25 MCG (1000 UNIT) PO TABS
2000.0000 [IU] | ORAL_TABLET | Freq: Every day | ORAL | Status: DC
  Administered 2023-07-12: 2000 [IU] via ORAL
  Filled 2023-07-11 (×2): qty 2

## 2023-07-11 MED ORDER — CHLORHEXIDINE GLUCONATE 0.12 % MT SOLN
15.0000 mL | Freq: Once | OROMUCOSAL | Status: AC
Start: 2023-07-11 — End: 2023-07-11
  Administered 2023-07-11: 15 mL via OROMUCOSAL

## 2023-07-11 MED ORDER — SODIUM CHLORIDE 0.9 % IR SOLN
Status: DC | PRN
  Administered 2023-07-11: 1000 mL

## 2023-07-11 MED ORDER — METHOCARBAMOL 500 MG PO TABS
500.0000 mg | ORAL_TABLET | Freq: Four times a day (QID) | ORAL | Status: DC | PRN
  Administered 2023-07-11 – 2023-07-12 (×3): 500 mg via ORAL
  Filled 2023-07-11 (×3): qty 1

## 2023-07-11 MED ORDER — TRANEXAMIC ACID-NACL 1000-0.7 MG/100ML-% IV SOLN
1000.0000 mg | INTRAVENOUS | Status: AC
  Administered 2023-07-11: 1000 mg via INTRAVENOUS
  Filled 2023-07-11: qty 100

## 2023-07-11 MED ORDER — FENTANYL CITRATE (PF) 100 MCG/2ML IJ SOLN
INTRAMUSCULAR | Status: AC
  Filled 2023-07-11: qty 2

## 2023-07-11 MED ORDER — ONDANSETRON HCL 4 MG/2ML IJ SOLN: 4.0000 mg | Freq: Once | INTRAMUSCULAR | Status: DC | PRN

## 2023-07-11 MED ORDER — ONDANSETRON HCL 4 MG/2ML IJ SOLN
4.0000 mg | Freq: Four times a day (QID) | INTRAMUSCULAR | Status: DC | PRN
  Administered 2023-07-11 – 2023-07-12 (×2): 4 mg via INTRAVENOUS
  Filled 2023-07-11 (×2): qty 2

## 2023-07-11 MED ORDER — OXYCODONE HCL 5 MG PO TABS: 5.0000 mg | ORAL_TABLET | ORAL | Status: DC | PRN

## 2023-07-11 MED ORDER — FENTANYL CITRATE PF 50 MCG/ML IJ SOSY
PREFILLED_SYRINGE | INTRAMUSCULAR | Status: AC
  Filled 2023-07-11: qty 1

## 2023-07-11 MED ORDER — CEFAZOLIN SODIUM-DEXTROSE 1-4 GM/50ML-% IV SOLN
1.0000 g | Freq: Four times a day (QID) | INTRAVENOUS | Status: AC
  Administered 2023-07-11 (×2): 1 g via INTRAVENOUS
  Filled 2023-07-11 (×2): qty 50

## 2023-07-11 MED ORDER — ONDANSETRON HCL 4 MG/2ML IJ SOLN
INTRAMUSCULAR | Status: AC
  Filled 2023-07-11: qty 2

## 2023-07-11 MED ORDER — SIMVASTATIN 40 MG PO TABS
40.0000 mg | ORAL_TABLET | Freq: Every evening | ORAL | Status: DC
  Administered 2023-07-11: 40 mg via ORAL
  Filled 2023-07-11: qty 1

## 2023-07-11 MED ORDER — PAROXETINE HCL ER 12.5 MG PO TB24
25.0000 mg | ORAL_TABLET | Freq: Every day | ORAL | Status: DC
  Administered 2023-07-12: 25 mg via ORAL
  Filled 2023-07-11: qty 1
  Filled 2023-07-11: qty 2

## 2023-07-11 MED ORDER — ACETAMINOPHEN 500 MG PO TABS
1000.0000 mg | ORAL_TABLET | Freq: Once | ORAL | Status: AC
  Administered 2023-07-11: 1000 mg via ORAL
  Filled 2023-07-11: qty 2

## 2023-07-11 MED ORDER — LORAZEPAM 0.5 MG PO TABS: 0.5000 mg | ORAL_TABLET | Freq: Every day | ORAL | Status: DC | PRN

## 2023-07-11 MED ORDER — BUPIVACAINE IN DEXTROSE 0.75-8.25 % IT SOLN
INTRATHECAL | Status: DC | PRN
  Administered 2023-07-11: 1.6 mL via INTRATHECAL

## 2023-07-11 MED ORDER — EPHEDRINE 5 MG/ML INJ
INTRAVENOUS | Status: AC
  Filled 2023-07-11: qty 5

## 2023-07-11 MED ORDER — ALUM & MAG HYDROXIDE-SIMETH 200-200-20 MG/5ML PO SUSP: 30.0000 mL | ORAL | Status: DC | PRN

## 2023-07-11 MED ORDER — ONDANSETRON HCL 4 MG PO TABS: 4.0000 mg | ORAL_TABLET | Freq: Four times a day (QID) | ORAL | Status: DC | PRN

## 2023-07-11 MED ORDER — HYDROMORPHONE HCL 1 MG/ML IJ SOLN
0.5000 mg | INTRAMUSCULAR | Status: DC | PRN
  Administered 2023-07-11 (×2): 1 mg via INTRAVENOUS
  Filled 2023-07-11 (×2): qty 1

## 2023-07-11 MED ORDER — ALBUMIN HUMAN 5 % IV SOLN
INTRAVENOUS | Status: AC
  Filled 2023-07-11: qty 250

## 2023-07-11 MED ORDER — EPHEDRINE SULFATE (PRESSORS) 50 MG/ML IJ SOLN
INTRAMUSCULAR | Status: DC | PRN
  Administered 2023-07-11: 5 mg via INTRAVENOUS

## 2023-07-11 MED ORDER — ACETAMINOPHEN 325 MG PO TABS: 325.0000 mg | ORAL_TABLET | Freq: Four times a day (QID) | ORAL | Status: DC | PRN

## 2023-07-11 MED ORDER — METOCLOPRAMIDE HCL 5 MG PO TABS
5.0000 mg | ORAL_TABLET | Freq: Three times a day (TID) | ORAL | Status: DC | PRN
  Administered 2023-07-12: 10 mg via ORAL
  Filled 2023-07-11: qty 2

## 2023-07-11 MED ORDER — SODIUM CHLORIDE 0.9 % IV SOLN
INTRAVENOUS | Status: DC
Start: 2023-07-11 — End: 2023-07-12

## 2023-07-11 MED ORDER — ALBUMIN HUMAN 5 % IV SOLN: INTRAVENOUS | Status: DC | PRN

## 2023-07-11 MED ORDER — MENTHOL 3 MG MT LOZG: 1.0000 | LOZENGE | OROMUCOSAL | Status: DC | PRN

## 2023-07-11 MED ORDER — PHENYLEPHRINE HCL-NACL 20-0.9 MG/250ML-% IV SOLN
INTRAVENOUS | Status: DC | PRN
  Administered 2023-07-11: 30 ug/min via INTRAVENOUS

## 2023-07-11 MED ORDER — OXYCODONE HCL 5 MG PO TABS
10.0000 mg | ORAL_TABLET | ORAL | Status: DC | PRN
  Administered 2023-07-11: 15 mg via ORAL
  Administered 2023-07-11: 10 mg via ORAL
  Administered 2023-07-11 – 2023-07-12 (×2): 15 mg via ORAL
  Filled 2023-07-11: qty 3
  Filled 2023-07-11: qty 2
  Filled 2023-07-11 (×2): qty 3

## 2023-07-11 MED ORDER — GLYCOPYRROLATE 0.2 MG/ML IJ SOLN
INTRAMUSCULAR | Status: DC | PRN
  Administered 2023-07-11 (×2): .1 mg via INTRAVENOUS

## 2023-07-11 MED ORDER — METOCLOPRAMIDE HCL 5 MG/ML IJ SOLN: 5.0000 mg | Freq: Three times a day (TID) | INTRAMUSCULAR | Status: DC | PRN

## 2023-07-11 MED ORDER — DEXAMETHASONE SODIUM PHOSPHATE 10 MG/ML IJ SOLN
INTRAMUSCULAR | Status: AC
Start: 2023-07-11 — End: ?
  Filled 2023-07-11: qty 1

## 2023-07-11 MED ORDER — LIDOCAINE HCL (CARDIAC) PF 100 MG/5ML IV SOSY
PREFILLED_SYRINGE | INTRAVENOUS | Status: DC | PRN
  Administered 2023-07-11: 10 mg via INTRAVENOUS

## 2023-07-11 MED ORDER — PHENYLEPHRINE HCL-NACL 20-0.9 MG/250ML-% IV SOLN
INTRAVENOUS | Status: AC
  Filled 2023-07-11: qty 250

## 2023-07-11 MED ORDER — DEXMEDETOMIDINE HCL IN NACL 80 MCG/20ML IV SOLN
INTRAVENOUS | Status: DC | PRN
  Administered 2023-07-11: 8 ug via INTRAVENOUS

## 2023-07-11 MED ORDER — GLYCOPYRROLATE 0.2 MG/ML IJ SOLN
INTRAMUSCULAR | Status: AC
Start: 2023-07-11 — End: ?
  Filled 2023-07-11: qty 1

## 2023-07-11 MED ORDER — ASPIRIN 81 MG PO CHEW
81.0000 mg | CHEWABLE_TABLET | Freq: Two times a day (BID) | ORAL | Status: DC
  Administered 2023-07-11 – 2023-07-12 (×2): 81 mg via ORAL
  Filled 2023-07-11 (×2): qty 1

## 2023-07-11 MED ORDER — METHOCARBAMOL 1000 MG/10ML IJ SOLN: 500.0000 mg | Freq: Four times a day (QID) | INTRAMUSCULAR | Status: DC | PRN

## 2023-07-11 MED ORDER — ORAL CARE MOUTH RINSE: 15.0000 mL | Freq: Once | OROMUCOSAL | Status: AC

## 2023-07-11 MED ORDER — PHENOL 1.4 % MT LIQD: 1.0000 | OROMUCOSAL | Status: DC | PRN

## 2023-07-11 MED ORDER — LIDOCAINE HCL (PF) 2 % IJ SOLN
INTRAMUSCULAR | Status: AC
Start: 2023-07-11 — End: ?
  Filled 2023-07-11: qty 5

## 2023-07-11 MED ORDER — LACTATED RINGERS IV SOLN: INTRAVENOUS | Status: DC

## 2023-07-11 MED ORDER — FENTANYL CITRATE (PF) 100 MCG/2ML IJ SOLN
INTRAMUSCULAR | Status: DC | PRN
  Administered 2023-07-11: 25 ug via INTRAVENOUS
  Administered 2023-07-11: 50 ug via INTRAVENOUS

## 2023-07-11 MED ORDER — CEFAZOLIN SODIUM-DEXTROSE 2-4 GM/100ML-% IV SOLN
2.0000 g | INTRAVENOUS | Status: AC
  Administered 2023-07-11: 2 g via INTRAVENOUS
  Filled 2023-07-11: qty 100

## 2023-07-11 MED ORDER — PROPOFOL 1000 MG/100ML IV EMUL
INTRAVENOUS | Status: AC
Start: 2023-07-11 — End: ?
  Filled 2023-07-11: qty 100

## 2023-07-11 MED ORDER — FENTANYL CITRATE PF 50 MCG/ML IJ SOSY
25.0000 ug | PREFILLED_SYRINGE | INTRAMUSCULAR | Status: DC | PRN
  Administered 2023-07-11: 50 ug via INTRAVENOUS

## 2023-07-11 MED ORDER — ONDANSETRON HCL 4 MG/2ML IJ SOLN
INTRAMUSCULAR | Status: DC | PRN
  Administered 2023-07-11: 4 mg via INTRAVENOUS

## 2023-07-11 SURGICAL SUPPLY — 43 items
APL SKNCLS STERI-STRIP NONHPOA (GAUZE/BANDAGES/DRESSINGS)
BAG COUNTER SPONGE SURGICOUNT (BAG) ×1 IMPLANT
BAG SPEC THK2 15X12 ZIP CLS (MISCELLANEOUS) ×1
BAG SPNG CNTER NS LX DISP (BAG) ×1
BAG ZIPLOCK 12X15 (MISCELLANEOUS) IMPLANT
BENZOIN TINCTURE PRP APPL 2/3 (GAUZE/BANDAGES/DRESSINGS) IMPLANT
BLADE SAW SGTL 18X1.27X75 (BLADE) ×1 IMPLANT
COVER PERINEAL POST (MISCELLANEOUS) ×1 IMPLANT
COVER SURGICAL LIGHT HANDLE (MISCELLANEOUS) ×1 IMPLANT
CUP SECTOR GRIPTON 50MM (Cup) IMPLANT
DRAPE FOOT SWITCH (DRAPES) ×1 IMPLANT
DRAPE STERI IOBAN 125X83 (DRAPES) ×1 IMPLANT
DRAPE U-SHAPE 47X51 STRL (DRAPES) ×2 IMPLANT
DRSG AQUACEL AG ADV 3.5X10 (GAUZE/BANDAGES/DRESSINGS) ×1 IMPLANT
DURAPREP 26ML APPLICATOR (WOUND CARE) ×1 IMPLANT
ELECT REM PT RETURN 15FT ADLT (MISCELLANEOUS) ×1 IMPLANT
GAUZE XEROFORM 1X8 LF (GAUZE/BANDAGES/DRESSINGS) IMPLANT
GLOVE BIO SURGEON STRL SZ7.5 (GLOVE) ×1 IMPLANT
GLOVE BIOGEL PI IND STRL 8 (GLOVE) ×2 IMPLANT
GLOVE ECLIPSE 8.0 STRL XLNG CF (GLOVE) ×1 IMPLANT
GOWN STRL REUS W/ TWL XL LVL3 (GOWN DISPOSABLE) ×2 IMPLANT
GOWN STRL REUS W/TWL XL LVL3 (GOWN DISPOSABLE) ×2
HANDPIECE INTERPULSE COAX TIP (DISPOSABLE) ×1
HEAD FEM STD 32X+5 STRL (Hips) IMPLANT
HOLDER FOLEY CATH W/STRAP (MISCELLANEOUS) ×1 IMPLANT
KIT TURNOVER KIT A (KITS) IMPLANT
LINER ACETABULAR 32X50 (Liner) IMPLANT
PACK ANTERIOR HIP CUSTOM (KITS) ×1 IMPLANT
SET HNDPC FAN SPRY TIP SCT (DISPOSABLE) ×1 IMPLANT
STAPLER SKIN PROX WIDE 3.9 (STAPLE) IMPLANT
STEM FEM ACTIS HIGH SZ3 (Stem) IMPLANT
STRIP CLOSURE SKIN 1/2X4 (GAUZE/BANDAGES/DRESSINGS) IMPLANT
SUT ETHIBOND NAB CT1 #1 30IN (SUTURE) ×1 IMPLANT
SUT ETHILON 2 0 PS N (SUTURE) IMPLANT
SUT MNCRL AB 4-0 PS2 18 (SUTURE) IMPLANT
SUT VIC AB 0 CT1 36 (SUTURE) ×1 IMPLANT
SUT VIC AB 1 CT1 36 (SUTURE) ×1 IMPLANT
SUT VIC AB 2-0 CT1 27 (SUTURE) ×2
SUT VIC AB 2-0 CT1 TAPERPNT 27 (SUTURE) ×2 IMPLANT
TRAY FOLEY MTR SLVR 16FR STAT (SET/KITS/TRAYS/PACK) IMPLANT
TRAY FOLEY W/BAG SLVR 14FR LF (SET/KITS/TRAYS/PACK) IMPLANT
WATER STERILE IRR 1000ML POUR (IV SOLUTION) IMPLANT
YANKAUER SUCT BULB TIP NO VENT (SUCTIONS) ×1 IMPLANT

## 2023-07-11 NOTE — Anesthesia Procedure Notes (Signed)
Spinal  Patient location during procedure: OR Start time: 07/11/2023 7:36 AM End time: 07/11/2023 7:46 AM Reason for block: surgical anesthesia Staffing Performed: anesthesiologist  Anesthesiologist: Collene Schlichter, MD Performed by: Collene Schlichter, MD Authorized by: Collene Schlichter, MD   Preanesthetic Checklist Completed: patient identified, IV checked, risks and benefits discussed, surgical consent, monitors and equipment checked, pre-op evaluation and timeout performed Spinal Block Patient position: sitting Prep: DuraPrep and site prepped and draped Patient monitoring: continuous pulse ox and blood pressure Approach: midline Location: L3-4 Injection technique: single-shot Needle Needle type: Pencan and Whitacre  Needle gauge: 22 G Assessment Events: CSF return Additional Notes Functioning IV was confirmed and monitors were applied. Sterile prep and drape, including hand hygiene, mask and sterile gloves were used. The patient was positioned and the spine was prepped. The skin was anesthetized with lidocaine.  Free flow of clear CSF was obtained prior to injecting local anesthetic into the CSF.  The spinal needle aspirated freely following injection.  The needle was carefully withdrawn.  The patient tolerated the procedure well. Consent was obtained prior to procedure with all questions answered and concerns addressed. Risks including but not limited to bleeding, infection, nerve damage, paralysis, failed block, inadequate analgesia, allergic reaction, high spinal, itching and headache were discussed and the patient wished to proceed.   Multiple attempts by CRNA, MDA with 24ga Pencan, unsuccessful. Attempt x1 with 22ga Whitacre needle by MDA with +CSF return.  Arrie Aran, MD

## 2023-07-11 NOTE — Anesthesia Postprocedure Evaluation (Signed)
Anesthesia Post Note  Patient: Nichole [name]  Procedure(s) Performed: LEFT TOTAL HIP ARTHROPLASTY ANTERIOR APPROACH (Left: Hip)     Patient location during evaluation: PACU Anesthesia Type: Spinal Level of consciousness: awake, awake and alert and oriented Pain management: pain level controlled Vital Signs Assessment: post-procedure vital signs reviewed and stable Respiratory status: spontaneous breathing, nonlabored ventilation and respiratory function stable Cardiovascular status: blood pressure returned to baseline and stable Postop Assessment: no headache, no backache, spinal receding and no apparent nausea or vomiting Anesthetic complications: no   No notable events documented.  Last Vitals:    Last Pain:                 Collene Schlichter

## 2023-07-11 NOTE — Interval H&P Note (Signed)
History and Physical Interval Note: The patient understands that she is here for a left total hip arthroplasty to treat her left hip arthritis and significant pain with subchondral edema and an insufficiency fracture.  Having had a total hip arthroplasty on her right side, she is fully aware what the surgery involves and understands the risks and benefits of surgery.  There has been no acute change in her medical status other than the rapid worsening of her left hip pain.  Informed consent has been obtained and the left operative hip has been marked.  07/11/2023 6:49 AM  Nichole [name]  has presented today for surgery, with the diagnosis of osteoarthritis and insufficiency fracture left hip.  The various methods of treatment have been discussed with the patient and family. After consideration of risks, benefits and other options for treatment, the patient has consented to  Procedure(s): LEFT TOTAL HIP ARTHROPLASTY ANTERIOR APPROACH (Left) as a surgical intervention.  The patient's history has been reviewed, patient examined, no change in status, stable for surgery.  I have reviewed the patient's chart and labs.  Questions were answered to the patient's satisfaction.     Kathryne Hitch

## 2023-07-11 NOTE — Transfer of Care (Signed)
Immediate Anesthesia Transfer of Care Note  Patient: Nichole [name]  Procedure(s) Performed: LEFT TOTAL HIP ARTHROPLASTY ANTERIOR APPROACH (Left: Hip)  Patient Location: PACU  Anesthesia Type:Spinal  Level of Consciousness: awake, alert , oriented, and patient cooperative  Airway & Oxygen Therapy: Patient Spontanous Breathing and Patient connected to face mask oxygen  Post-op Assessment: Report given to RN and Post -op Vital signs reviewed and stable  Post vital signs: Reviewed and stable  Last Vitals:  Vitals Value Taken Time  BP 113/57 07/11/23 0925  Temp    Pulse 72 07/11/23 0928  Resp 14 07/11/23 0928  SpO2 100 % 07/11/23 0928  Vitals shown include unfiled device data.  Last Pain:  Vitals:   07/11/23 0650  TempSrc: Oral  PainSc: 3       Patients Stated Pain Goal: 3 (07/11/23 0650)  Complications: No notable events documented.

## 2023-07-11 NOTE — Anesthesia Preprocedure Evaluation (Signed)
Anesthesia Evaluation  Patient identified by MRN, date of birth, ID band Patient awake    Reviewed: Allergy & Precautions, NPO status , Patient's Chart, lab work & pertinent test results  Airway Mallampati: II  TM Distance: >3 FB Neck ROM: Full    Dental  (+) Teeth Intact, Dental Advisory Given   Pulmonary neg pulmonary ROS   Pulmonary exam normal breath sounds clear to auscultation       Cardiovascular negative cardio ROS Normal cardiovascular exam Rhythm:Regular Rate:Normal     Neuro/Psych  PSYCHIATRIC DISORDERS Anxiety     negative neurological ROS     GI/Hepatic negative GI ROS, Neg liver ROS,,,  Endo/Other  negative endocrine ROS    Renal/GU negative Renal ROS     Musculoskeletal  (+) Arthritis ,  osteoarthritis and insufficiency fracture left hip   Abdominal   Peds  Hematology negative hematology ROS (+)   Anesthesia Other Findings Day of surgery medications reviewed with the patient.  Reproductive/Obstetrics                             Anesthesia Physical Anesthesia Plan  ASA: 2  Anesthesia Plan: Spinal   Post-op Pain Management: Tylenol PO (pre-op)*   Induction: Intravenous  PONV Risk Score and Plan: 2 and Dexamethasone, Ondansetron and TIVA  Airway Management Planned: Natural Airway and Simple Face Mask  Additional Equipment:   Intra-op Plan:   Post-operative Plan:   Informed Consent: I have reviewed the patients History and Physical, chart, labs and discussed the procedure including the risks, benefits and alternatives for the proposed anesthesia with the patient or authorized representative who has indicated his/her understanding and acceptance.     Dental advisory given  Plan Discussed with: CRNA  Anesthesia Plan Comments:        Anesthesia Quick Evaluation

## 2023-07-11 NOTE — Op Note (Signed)
Operative Note  Date of operation: 07/11/2023 Preoperative diagnosis: Left hip osteoarthritis with insufficiency fracture Postoperative diagnosis: Same  Procedure: Left direct anterior total hip arthroplasty  Implants: Implant Name Type Inv. Item Serial No. Manufacturer Lot No. LRB No. Used Action  CUP SECTOR GRIPTON - [id number] Cup CUP SECTOR GRIPTON  [employer]PU[employer] [id number] Left 1 Implanted  LINER ACETABULAR 32X50 - [id number] Liner LINER ACETABULAR 32X50  DEPUY ORTH[employer] [id number] Left 1 Implanted  STEM FEM ACTIS HIGH SZ3 - [id number] Stem STEM FEM ACTIS HIGH SZ3  DEPUY ORTH[employer] [id number] Left 1 Implanted  HEAD FEM STD 32X+5 STRL - [id number] Hips HEAD FEM STD 32X+5 STRL  DEPU[employer]TH[employer] [id number] Left 1 Implanted   Surgeon: Vanita Panda. Magnus Ivan, MD Assistant: Rexene Edison, PA-C  Anesthesia: Spinal EBL: 300 cc Antibiotics: IV Ancef Complications: None  Indications: The patient is a 77 year old female who we have replaced her right hip before 2 years ago secondary to primary osteoarthritis.  She does have arthritis in her left hip but just 6 to 8 weeks ago she developed excruciating left hip pain with no injury.  Her x-rays did not show severe wear of the hip so we did obtain an MRI of her left hip.  The MRI shows significant subchondral edema in the femoral head all the way into the femoral neck and extending to the intertrochanteric region.  This is more consistent with a fracture as well.  Her pain has become severe and debilitating.  It is detrimentally affecting her mobility, her quality of life and her actives daily living and she is having a hard time ambulating.  We have recommended a total hip arthroplasty and she agrees with this as well.  Having had this before on the right side she is fully aware of the risk of acute blood loss anemia, nerve or vessel injury, fracture, infection, DVT, implant failure, dislocation, leg length differences and wound healing  issues.  She understands that our goals are hopefully decrease pain, improve mobility, and improve quality of life.  Procedure description: After informed consent was obtained and the appropriate left hip was marked, the patient was brought to the operating room and set up on the stretcher where spinal anesthesia was obtained.  She was then laid in supine position on the stretcher and a Foley catheter was placed.  Traction boots were placed on both her feet and she was placed supine on the Hana fracture table with a perineal post in place in both legs and inline skeletal traction devices no traction applied.  Her left operative hip was assessed radiographically as well as the pelvis.  The left hip was prepped and draped with DuraPrep and sterile drapes.  A timeout was called and she was identified as the correct patient and the correct left hip.  An incision was then made just inferior and posterior to the ASIS and carried slightly obliquely down the leg.  Dissection was carried down to the tensor fascia lata muscle and the tensor fascia was divided longitudinally to proceed with a direct anterior approach to the hip.  Circumflex vessels were identified cauterized.  The hip capsule was identified and opened up in L-type format and it had a very large joint effusion under pressure.  I believe this is more consistent with some type of insufficiency fracture.  There was no hemarthrosis but just the way the soft tissue looked it appeared more like an acute insufficiency fracture.  Cobra retractors were placed around the medial and lateral femoral  neck and a femoral neck cut was made with an oscillating saw just proximal to the lesser trochanter and this cut was completed with an osteotome.  A corkscrew guide was placed in the femoral head the femoral head was removed in entirety.  There was an area that had significant cartilage wear.  A bent Hohmann was then placed over the medial acetabular rim and remnants of the  acetabular labrum and other debris removed.  Reaming was then initiated from a size 43 reamer and stepwise increments going up to a size 49 reamer with all reamers placed under direct visualization and the last reamers placed under direct fluoroscopy in order to obtain the depth of reaming, the inclination and the anteversion.  The real DePuy sector GRIPTION acetabular component size 50 was then placed without difficulty followed by a 32+0 neutral polyethylene liner.  Attention was then turned the femur.  With the left leg externally rotated to 120 degrees, extended and adducted, a Mueller retractor was placed medially and a Hohmann retractor was placed by the greater trochanter.  The lateral joint capsule was released and a box cutting osteotome was used in her femoral canal.  Broaching was then initiated using the Actis broaching system from a size 0 going up to a size 3.  With a size 3 in place we trialed a high offset femoral neck based off her anatomy and a 32+1 trial hip ball.  The right leg was brought over and up and with traction internal rotation reduced in the pelvis.  We felt like we needed a little bit more leg length for stability purposes.  We dislocated the hip and removed the trial components.  We then placed the real Actis femoral component with high offset size 3 and the real 32+5 metal head ball and again reduces the pelvis.  We are very pleased with range of motion and stability as well as offset.  I do feel like we made her little longer.  The soft tissue was then irrigated normal saline solution.  The joint capsule was closed with interrupted #1 Ethibond suture followed by #1 Vicryl to close the tensor fascia.  0 Vicryl was used to close the deep tissue and 2-0 Vicryl was used to close subcutaneous tissue.  The skin was closed with staples.  An Aquacel dressing was applied.  The patient was taken recovery room.  Rexene Edison, PA-C did assist during the entire case from beginning to end and his  assistance was crucial and medically necessary for soft tissue management and retraction, helping guide implant placement and a layered closure of the wound.

## 2023-07-11 NOTE — Plan of Care (Signed)
Arrived from PACU.  Alert and oriented. Vomited while working with PT.  Family and friends visiting. Medicated for pain with PRN pain medications. See MAR for details.   Problem: Education: Goal: Knowledge of General Education information will improve Description: Including pain rating scale, medication(s)/side effects and non-pharmacologic comfort measures Outcome: Progressing   Problem: Health Behavior/Discharge Planning: Goal: Ability to manage health-related needs will improve Outcome: Progressing   Problem: Clinical Measurements: Goal: Ability to maintain clinical measurements within normal limits will improve Outcome: Progressing Goal: Will remain free from infection Outcome: Progressing Goal: Diagnostic test results will improve Outcome: Progressing Goal: Respiratory complications will improve Outcome: Progressing   Problem: Education: Goal: Knowledge of the prescribed therapeutic regimen will improve Outcome: Progressing Goal: Understanding of discharge needs will improve Outcome: Progressing

## 2023-07-11 NOTE — Evaluation (Signed)
Physical Therapy Evaluation Patient Details Name: Nichole [name] MRN: [id number] DOB: 02/17/1946 Today's Date: 07/11/2023  History of Present Illness  Pt s/p L THR and with hx of R TKR and R THR  Clinical Impression  Pt admitted as above and presenting with functional mobility limitations 2* decreased L LE strength/ROM, post op pain, and significant N&V.  Pt plans follow up rehab at [facility]..        If plan is discharge home, recommend the following: A little help with walking and/or transfers;A little help with bathing/dressing/bathroom;Assistance with cooking/housework;Assist for transportation;Help with stairs or ramp for entrance   Can travel by private vehicle        Equipment Recommendations None recommended by PT  Recommendations for Other Services       Functional Status Assessment Patient has had a recent decline in their functional status and demonstrates the ability to make significant improvements in function in a reasonable and predictable amount of time.     Precautions / Restrictions Precautions Precautions: Fall Restrictions Weight Bearing Restrictions: No Other Position/Activity Restrictions: WAT      Mobility  Bed Mobility Overal bed mobility: Needs Assistance Bed Mobility: Supine to Sit, Sit to Supine     Supine to sit: Mod assist Sit to supine: Mod assist   General bed mobility comments: cues for sequence and use of R LE to self assist.  Physical assist to control trunk and to manage L LE    Transfers Overall transfer level: Needs assistance Equipment used: Rolling walker (2 wheels) Transfers: Sit to/from Stand Sit to Stand: Min assist, From elevated surface           General transfer comment: cues for LE management and use of UEs to self assist    Ambulation/Gait Ambulation/Gait assistance: Min assist Gait Distance (Feet): 8 Feet Assistive device: Rolling walker (2 wheels) Gait Pattern/deviations: Step-to pattern, Decreased step  length - right, Decreased step length - left, Shuffle, Trunk flexed Gait velocity: decr     General Gait Details: cues for sequence, posture and position from RW; distance ltd by nausea  Stairs            Wheelchair Mobility     Tilt Bed    Modified Rankin (Stroke Patients Only)       Balance Overall balance assessment: Mild deficits observed, not formally tested                                           Pertinent Vitals/Pain Pain Assessment Pain Assessment: 0-10 Pain Score: 5  Pain Location: L hip Pain Descriptors / Indicators: Aching, Sore Pain Intervention(s): Limited activity within patient's tolerance, Monitored during session, Premedicated before session, Ice applied    Home Living Family/patient expects to be discharged to:: Skilled nursing facility                   Additional Comments: Rehab unit at [facility]    Prior Function Prior Level of Function : Needs assist             Mobility Comments: using rollator ADLs Comments: assist from spouse as needed     Extremity/Trunk Assessment   Upper Extremity Assessment Upper Extremity Assessment: Overall WFL for tasks assessed    Lower Extremity Assessment Lower Extremity Assessment: LLE deficits/detail    Cervical / Trunk Assessment Cervical / Trunk Assessment: Normal  Communication  Communication Communication: No apparent difficulties Cueing Techniques: Verbal cues  Cognition Arousal: Alert Behavior During Therapy: WFL for tasks assessed/performed Overall Cognitive Status: Within Functional Limits for tasks assessed                                          General Comments      Exercises Total Joint Exercises Ankle Circles/Pumps: AROM, Both, 15 reps, Supine   Assessment/Plan    PT Assessment Patient needs continued PT services  PT Problem List Decreased strength;Decreased range of motion;Decreased activity tolerance;Decreased  balance;Decreased mobility;Decreased knowledge of use of DME;Pain       PT Treatment Interventions Gait training;DME instruction;Functional mobility training;Therapeutic activities;Therapeutic exercise;Balance training;Patient/family education    PT Goals (Current goals can be found in the Care Plan section)  Acute Rehab PT Goals Patient Stated Goal: Regain IND PT Goal Formulation: With patient Time For Goal Achievement: 07/24/23 Potential to Achieve Goals: Good    Frequency 7X/week     Co-evaluation               AM-PAC PT "6 Clicks" Mobility  Outcome Measure Help needed turning from your back to your side while in a flat bed without using bedrails?: A Lot Help needed moving from lying on your back to sitting on the side of a flat bed without using bedrails?: A Lot Help needed moving to and from a bed to a chair (including a wheelchair)?: A Lot Help needed standing up from a chair using your arms (e.g., wheelchair or bedside chair)?: A Little Help needed to walk in hospital room?: Total Help needed climbing 3-5 steps with a railing? : Total 6 Click Score: 11    End of Session Equipment Utilized During Treatment: Gait belt Activity Tolerance: Other (comment) Patient left: in bed;with call bell/phone within reach;with bed alarm set Nurse Communication: Mobility status PT Visit Diagnosis: Difficulty in walking, not elsewhere classified (R26.2)    Time: 4010-2725 PT Time Calculation (min) (ACUTE ONLY): 43 min   Charges:   PT Evaluation $PT Eval Low Complexity: 1 Low PT Treatments $Gait Training: 8-22 mins $Therapeutic Activity: 8-22 mins PT General Charges $$ ACUTE PT VISIT: 1 Visit         Mauro Kaufmann PT Acute Rehabilitation Services Pager [phone] Office [phone]   Dania Marsan 07/11/2023, 4:00 PM

## 2023-07-11 NOTE — Progress Notes (Addendum)
  Arrived to  [facility] room 1337 from PACU.     07/11/23 1142  Vitals  Temp 97.8 F (36.6 C)  Temp Source Oral  BP 138/75  MAP (mmHg) 92  BP Location Right Arm  BP Method Automatic  Patient Position (if appropriate) Lying  Pulse Rate (!) 59  Pulse Rate Source Dinamap  Resp 16  MEWS COLOR  MEWS Score Color Green  Oxygen Therapy  SpO2 100 %  O2 Device Room Air  Patient Activity (if Appropriate) In bed  Pulse Oximetry Type Intermittent  MEWS Score  MEWS Temp 0  MEWS Systolic 0  MEWS Pulse 0  MEWS RR 0  MEWS LOC 0  MEWS Score 0

## 2023-07-12 ENCOUNTER — Other Ambulatory Visit (HOSPITAL_BASED_OUTPATIENT_CLINIC_OR_DEPARTMENT_OTHER): Payer: Self-pay

## 2023-07-12 DIAGNOSIS — M1612 Unilateral primary osteoarthritis, left hip: Secondary | ICD-10-CM | POA: Diagnosis not present

## 2023-07-12 LAB — CBC
HCT: 30.1 % — ABNORMAL LOW (ref 36.0–46.0)
Hemoglobin: 9.7 g/dL — ABNORMAL LOW (ref 12.0–15.0)
MCH: 30 pg (ref 26.0–34.0)
MCHC: 32.2 g/dL (ref 30.0–36.0)
MCV: 93.2 fL (ref 80.0–100.0)
Platelets: 229 10*3/uL (ref 150–400)
RBC: 3.23 MIL/uL — ABNORMAL LOW (ref 3.87–5.11)
RDW: 12.5 % (ref 11.5–15.5)
WBC: 10.8 10*3/uL — ABNORMAL HIGH (ref 4.0–10.5)
nRBC: 0 % (ref 0.0–0.2)

## 2023-07-12 LAB — BASIC METABOLIC PANEL
Anion gap: 7 (ref 5–15)
BUN: 13 mg/dL (ref 8–23)
CO2: 25 mmol/L (ref 22–32)
Calcium: 8.4 mg/dL — ABNORMAL LOW (ref 8.9–10.3)
Chloride: 104 mmol/L (ref 98–111)
Creatinine, Ser: 0.78 mg/dL (ref 0.44–1.00)
GFR, Estimated: >60 mL/min (ref >60)
Glucose, Bld: 147 mg/dL — ABNORMAL HIGH (ref 70–99)
Potassium: 4.5 mmol/L (ref 3.5–5.1)
Sodium: 136 mmol/L (ref 135–145)

## 2023-07-12 MED ORDER — METHOCARBAMOL 500 MG PO TABS: 500.0000 mg | ORAL_TABLET | Freq: Four times a day (QID) | ORAL | 0 refills | Status: DC | PRN

## 2023-07-12 MED ORDER — ONDANSETRON HCL 4 MG PO TABS: 4.0000 mg | ORAL_TABLET | Freq: Four times a day (QID) | ORAL | 0 refills | Status: DC | PRN

## 2023-07-12 MED ORDER — OXYCODONE HCL 5 MG PO TABS
5.0000 mg | ORAL_TABLET | Freq: Four times a day (QID) | ORAL | 0 refills | Status: DC | PRN
  Filled 2023-07-12: qty 30, 4d supply, fill #0

## 2023-07-12 MED ORDER — ASPIRIN 81 MG PO CHEW
81.0000 mg | CHEWABLE_TABLET | Freq: Two times a day (BID) | ORAL | 0 refills | Status: DC
  Filled 2023-07-12: qty 30, 15d supply, fill #0

## 2023-07-12 MED ORDER — ONDANSETRON HCL 4 MG PO TABS
4.0000 mg | ORAL_TABLET | Freq: Four times a day (QID) | ORAL | 0 refills | Status: DC | PRN
  Filled 2023-07-12: qty 20, 5d supply, fill #0

## 2023-07-12 MED ORDER — OXYCODONE HCL 5 MG PO TABS: 5.0000 mg | ORAL_TABLET | Freq: Four times a day (QID) | ORAL | 0 refills | Status: DC | PRN

## 2023-07-12 MED ORDER — METHOCARBAMOL 500 MG PO TABS
500.0000 mg | ORAL_TABLET | Freq: Four times a day (QID) | ORAL | 0 refills | Status: DC | PRN
  Filled 2023-07-12: qty 30, 8d supply, fill #0

## 2023-07-12 MED ORDER — ASPIRIN 81 MG PO CHEW: 81.0000 mg | CHEWABLE_TABLET | Freq: Two times a day (BID) | ORAL | 0 refills | Status: DC

## 2023-07-12 NOTE — TOC Transition Note (Signed)
Transition of Care [facility]) - CM/SW Discharge Note   Patient Details  Name: Nichole [name] MRN: [id number] Date of Birth: June 17, 1946  [facility] CM/SW Contact:  Darleene Cleaver, LCSW Phone Number: 07/12/2023, 12:45 PM   Clinical Narrative:     CSW spoke to patient she is from [facility].  Per patient the plan is to go to [facility]'s short term rehab and then return back home.  CSW called Nurse manager Clydie Braun, and she said patient can be accepted today.  CSW updated bedside nurse and patient.  Patient to be d/c'ed today to [facility], room 155.  Patient and family agreeable to plans will transport via ems RN to call report to [phone].    Final next level of care: Skilled Nursing Facility Barriers to Discharge: Barriers Resolved   Patient Goals and CMS Choice CMS Medicare.gov Compare Post Acute Care list provided to:: Patient Choice offered to / list presented to : Patient  Discharge Placement  [facility].   Existing PASRR number confirmed : 07/12/23          Patient chooses bed at: [facility] Patient to be transferred to facility by: Patient's husband. Name of family member notified: Patient's husband is at bedside. Patient and family notified of of transfer: 07/12/23  Discharge Plan and Services Additional resources added to the After Visit Summary for                                       Social Determinants of Health (SDOH) Interventions SDOH Screenings   Food Insecurity: No Food Insecurity (07/11/2023)  Housing: Low Risk  (07/11/2023)  Transportation Needs: No Transportation Needs (07/11/2023)  Utilities: Not At Risk (07/11/2023)  Tobacco Use: Low Risk  (07/11/2023)     Readmission Risk Interventions     No data to display

## 2023-07-12 NOTE — Progress Notes (Signed)
Patient ID: Nichole [name], female   DOB: 03-03-1946, 77 y.o.   MRN: [id number] The patient is doing well this morning.  She has had some nausea.  She has ambulated.  Her left operative hip is stable.  Her vital signs are stable.  She is someone who has paid into [facility] and is a member there.  She has already been accepted into their rehab at that facility and can go there actually today.  She wants to go as well.  Her husband is with her at the bedside right now.  I can discharge her today or today.

## 2023-07-12 NOTE — Progress Notes (Signed)
Physical Therapy Treatment Patient Details Name: Nichole [name] MRN: [id number] DOB: [birth year] Today's Date: 07/12/2023   History of Present Illness Pt s/p L THR and with hx of R TKR and R THR    PT Comments  Pt very motivated but progressing slowly with mobility 2* ongoing nausea with activity.  Pt hopeful for dc to [facility] this date.    If plan is discharge home, recommend the following: A little help with walking and/or transfers;A little help with bathing/dressing/bathroom;Assistance with cooking/housework;Assist for transportation;Help with stairs or ramp for entrance   Can travel by private vehicle        Equipment Recommendations  None recommended by PT    Recommendations for Other Services       Precautions / Restrictions Precautions Precautions: Fall Restrictions Weight Bearing Restrictions: No Other Position/Activity Restrictions: WBAT     Mobility  Bed Mobility Overal bed mobility: Needs Assistance Bed Mobility: Supine to Sit     Supine to sit: Min assist, Mod assist     General bed mobility comments: cues for sequence and use of R LE to self assist.  Physical assist to control trunk and to manage L LE    Transfers Overall transfer level: Needs assistance Equipment used: Rolling walker (2 wheels) Transfers: Sit to/from Stand Sit to Stand: Min assist, From elevated surface           General transfer comment: cues for LE management and use of UEs to self assist    Ambulation/Gait Ambulation/Gait assistance: Min assist Gait Distance (Feet): 11 Feet Assistive device: Rolling walker (2 wheels) Gait Pattern/deviations: Step-to pattern, Decreased step length - right, Decreased step length - left, Shuffle, Trunk flexed Gait velocity: decr     General Gait Details: cues for sequence, posture and position from RW; distance ltd by nausea   Stairs             Wheelchair Mobility     Tilt Bed    Modified Rankin (Stroke Patients Only)        Balance Overall balance assessment: Mild deficits observed, not formally tested                                          Cognition Arousal: Alert Behavior During Therapy: WFL for tasks assessed/performed Overall Cognitive Status: Within Functional Limits for tasks assessed                                          Exercises Total Joint Exercises Ankle Circles/Pumps: AROM, Both, 15 reps, Supine Quad Sets: AROM, Both, 10 reps, Supine Heel Slides: AAROM, Left, 20 reps, Supine Hip ABduction/ADduction: AAROM, Left, 15 reps, Supine    General Comments        Pertinent Vitals/Pain Pain Assessment Pain Assessment: 0-10 Pain Score: 5  Pain Location: L hip Pain Descriptors / Indicators: Aching, Sore Pain Intervention(s): Limited activity within patient's tolerance, Monitored during session, Premedicated before session, Ice applied    Home Living                          Prior Function            PT Goals (current goals can now be found in the care plan section) Acute Rehab  PT Goals Patient Stated Goal: Regain IND PT Goal Formulation: With patient Time For Goal Achievement: 07/24/23 Potential to Achieve Goals: Good Progress towards PT goals: Progressing toward goals    Frequency    7X/week      PT Plan      Co-evaluation              AM-PAC PT "6 Clicks" Mobility   Outcome Measure  Help needed turning from your back to your side while in a flat bed without using bedrails?: A Lot Help needed moving from lying on your back to sitting on the side of a flat bed without using bedrails?: A Lot Help needed moving to and from a bed to a chair (including a wheelchair)?: A Lot Help needed standing up from a chair using your arms (e.g., wheelchair or bedside chair)?: A Little Help needed to walk in hospital room?: A Lot Help needed climbing 3-5 steps with a railing? : A Lot 6 Click Score: 13    End of Session  Equipment Utilized During Treatment: Gait belt Activity Tolerance: Patient tolerated treatment well Patient left: in bed;with call bell/phone within reach;with bed alarm set Nurse Communication: Mobility status PT Visit Diagnosis: Difficulty in walking, not elsewhere classified (R26.2)     Time: 4098-1191 PT Time Calculation (min) (ACUTE ONLY): 30 min  Charges:    $Gait Training: 8-22 mins $Therapeutic Exercise: 8-22 mins PT General Charges $$ ACUTE PT VISIT: 1 Visit                     Mauro Kaufmann PT Acute Rehabilitation Services Pager [phone] Office [phone]    Toan Mort 07/12/2023, 11:23 AM

## 2023-07-12 NOTE — Progress Notes (Signed)
D/c instructions provided to pt and husband. All questions answered. Report called to [facility]. D/c packet with script meds given to patient for facility. Pt ready for transport.

## 2023-07-12 NOTE — Care Management Obs Status (Signed)
MEDICARE OBSERVATION STATUS NOTIFICATION   Patient Details  Name: Nichole [name] MRN: [id number] Date of Birth: 09-21-1945   Medicare Observation Status Notification Given:  Yes    Halford Chessman 07/12/2023, 12:43 PM

## 2023-07-12 NOTE — Discharge Instructions (Signed)

## 2023-07-12 NOTE — NC FL2 (Signed)
[place] MEDICA[facility] LEVEL OF CARE FORM     IDENTIFICATION  Patient Name: Nichole [name] Birthdate: 05/08/1946 Sex: female Admission Date (Current Location): 07/11/2023  [facility] and [place] Number:  Producer, television/film/video and Address:  [facility],  [address] [place], [place] [address]      Provider Number: [id number]  Attending Physician Name and Address:  Kathryne Hitch,*  Relative Name and Phone Number:  GAYLON, THEARD [phone]  [phone]    Current Level of Care: Hospital Recommended Level of Care: Skilled Nursing Facility Prior Approval Number:    Date Approved/Denied:   PASRR Number: [id number]  Discharge Plan: SNF    Current Diagnoses: Patient Active Problem List   Diagnosis Date Noted   Status post total replacement of left hip 07/11/2023   Unilateral primary osteoarthritis, left hip 07/07/2023   Arthritis of knee, right 04/25/2022   Pain in right knee 04/15/2022   Status post total replacement of right hip 01/18/2022   Unilateral primary osteoarthritis, right hip 12/24/2021   Sensorineural hearing loss (SNHL), bilateral 07/28/2019   Temporomandibular jaw dysfunction 07/28/2019    Orientation RESPIRATION BLADDER Height & Weight     Self, Time, Situation, Place  Normal Continent Weight: 150 lb (68 kg) Height:  5\' 4"  (162.6 cm)  BEHAVIORAL SYMPTOMS/MOOD NEUROLOGICAL BOWEL NUTRITION STATUS      Continent Diet  AMBULATORY STATUS COMMUNICATION OF NEEDS Skin   Limited Assist Verbally Surgical wounds                       Personal Care Assistance Level of Assistance  Bathing, Feeding, Dressing Bathing Assistance: Limited assistance Feeding assistance: Independent Dressing Assistance: Limited assistance     Functional Limitations Info  Hearing, Speech, Sight Sight Info: Adequate Hearing Info: Adequate Speech Info: Adequate    SPECIAL CARE FACTORS FREQUENCY  PT (By licensed PT), OT (By licensed OT)     PT  Frequency: Minimum 5x a week OT Frequency: Minimum 5x a week            Contractures Contractures Info: Not present    Additional Factors Info  Code Status, Allergies, Psychotropic Code Status Info: Full code Allergies Info: Corticosteroids Psychotropic Info: PARoxetine (PAXIL-CR) 24 hr tablet 25 mg         Current Medications (07/12/2023):  This is the current hospital active medication list Current Facility-Administered Medications  Medication Dose Route Frequency Provider Last Rate Last Admin   0.9 %  sodium chloride infusion   Intravenous Continuous Kathryne Hitch, MD 75 mL/hr at 07/12/23 0600 Infusion Verify at 07/12/23 0600   acetaminophen (TYLENOL) tablet 325-650 mg  325-650 mg Oral Q6H PRN Kathryne Hitch, MD       alum & mag hydroxide-simeth (MAALOX/MYLANTA) 200-200-20 MG/5ML suspension 30 mL  30 mL Oral Q4H PRN Kathryne Hitch, MD       aspirin chewable tablet 81 mg  81 mg Oral BID Kathryne Hitch, MD   81 mg at 07/12/23 4132   cholecalciferol (VITAMIN D3) tablet 2,000 Units  2,000 Units Oral Daily Kathryne Hitch, MD   2,000 Units at 07/12/23 0834   diphenhydrAMINE (BENADRYL) 12.5 MG/5ML elixir 12.5-25 mg  12.5-25 mg Oral Q4H PRN Kathryne Hitch, MD       docusate sodium (COLACE) capsule 100 mg  100 mg Oral BID Kathryne Hitch, MD   100 mg at 07/12/23 0834   ezetimibe (ZETIA) tablet 10 mg  10 mg  Oral QPM Kathryne Hitch, MD   10 mg at 07/11/23 1802   HYDROmorphone (DILAUDID) injection 0.5-1 mg  0.5-1 mg Intravenous Q4H PRN Kathryne Hitch, MD   1 mg at 07/11/23 2037   LORazepam (ATIVAN) tablet 0.5 mg  0.5 mg Oral Daily PRN Kathryne Hitch, MD       menthol-cetylpyridinium (CEPACOL) lozenge 3 mg  1 lozenge Oral PRN Kathryne Hitch, MD       Or   phenol (CHLORASEPTIC) mouth spray 1 spray  1 spray Mouth/Throat PRN Kathryne Hitch, MD       methocarbamol (ROBAXIN) tablet 500 mg   500 mg Oral Q6H PRN Kathryne Hitch, MD   500 mg at 07/12/23 1610   Or   methocarbamol (ROBAXIN) injection 500 mg  500 mg Intravenous Q6H PRN Kathryne Hitch, MD       metoCLOPramide (REGLAN) tablet 5-10 mg  5-10 mg Oral Q8H PRN Kathryne Hitch, MD       Or   metoCLOPramide (REGLAN) injection 5-10 mg  5-10 mg Intravenous Q8H PRN Kathryne Hitch, MD       ondansetron [facility]) tablet 4 mg  4 mg Oral Q6H PRN Kathryne Hitch, MD       Or   ondansetron [facility]) injection 4 mg  4 mg Intravenous Q6H PRN Kathryne Hitch, MD   4 mg at 07/12/23 1011   oxyCODONE (Oxy IR/ROXICODONE) immediate release tablet 10-15 mg  10-15 mg Oral Q4H PRN Kathryne Hitch, MD   15 mg at 07/12/23 9604   oxyCODONE (Oxy IR/ROXICODONE) immediate release tablet 5-10 mg  5-10 mg Oral Q4H PRN Kathryne Hitch, MD       pantoprazole (PROTONIX) EC tablet 40 mg  40 mg Oral Daily Kathryne Hitch, MD   40 mg at 07/12/23 0834   PARoxetine (PAXIL-CR) 24 hr tablet 25 mg  25 mg Oral Daily Kathryne Hitch, MD   25 mg at 07/12/23 0834   simvastatin (ZOCOR) tablet 40 mg  40 mg Oral QPM Kathryne Hitch, MD   40 mg at 07/11/23 1802     Discharge Medications: Please see discharge summary for a list of discharge medications.  Relevant Imaging Results:  Relevant Lab Results:   Additional Information SS# [id number]  Darleene Cleaver, [place]

## 2023-07-12 NOTE — Discharge Summary (Signed)
Patient ID: Nichole [name] MRN: [id number] DOB/AGE: [birth year] 77 y.o.  Admit date: 07/11/2023 Discharge date: 07/12/2023  Admission Diagnoses:  Principal Problem:   Unilateral primary osteoarthritis, left hip Active Problems:   Status post total replacement of left hip   Discharge Diagnoses:  Same  Past Medical History:  Diagnosis Date   Anxiety    Arthritis    Cancer (HCC)    melanoma   HDL deficiency    Pre-diabetes    Diet controlled    Surgeries: Procedure(s): LEFT TOTAL HIP ARTHROPLASTY ANTERIOR APPROACH on 07/11/2023   Consultants:   Discharged Condition: Improved  Hospital Course: Nichole [name] is an 77 y.o. female who was admitted 07/11/2023 for operative treatment ofUnilateral primary osteoarthritis, left hip. Patient has severe unremitting pain that affects sleep, daily activities, and work/hobbies. After pre-op clearance the patient was taken to the operating room on 07/11/2023 and underwent  Procedure(s): LEFT TOTAL HIP ARTHROPLASTY ANTERIOR APPROACH.    Patient was given perioperative antibiotics:  Anti-infectives (From admission, onward)    Start     Dose/Rate Route Frequency Ordered Stop   07/11/23 1400  ceFAZolin (ANCEF) IVPB 1 g/50 mL premix        1 g 100 mL/hr over 30 Minutes Intravenous Every 6 hours 07/11/23 1134 07/12/23 0836   07/11/23 0645  ceFAZolin (ANCEF) IVPB 2g/100 mL premix        2 g 200 mL/hr over 30 Minutes Intravenous On call to O.R. 07/11/23 8841 07/11/23 0748        Patient was given sequential compression devices, early ambulation, and chemoprophylaxis to prevent DVT.  Patient benefited maximally from hospital stay and there were no complications.    Recent vital signs: Patient Vitals for the past 24 hrs:  BP Temp Temp src Pulse Resp SpO2  07/12/23 0626 108/62 98.9 F (37.2 C) -- 78 17 96 %  07/12/23 0119 110/64 99.7 F (37.6 C) -- 88 17 93 %  07/11/23 2019 120/60 98.4 F (36.9 C) -- 75 18 96 %  07/11/23 1732 (!)  128/59 97.6 F (36.4 C) -- 68 18 93 %  07/11/23 1349 (!) 120/59 97.8 F (36.6 C) Axillary (!) 58 17 100 %  07/11/23 1142 138/75 97.8 F (36.6 C) Oral (!) 59 16 100 %     Recent laboratory studies:  Recent Labs    07/10/23 1430 07/12/23 0354  WBC 10.0 10.8*  HGB 12.7 9.7*  HCT 39.4 30.1*  PLT 335 229  NA 136 136  K 4.7 4.5  CL 102 104  CO2 25 25  BUN 26* 13  CREATININE 0.92 0.78  GLUCOSE 102* 147*  CALCIUM 9.4 8.4*     Discharge Medications:   Allergies as of 07/12/2023       Reactions   Corticosteroids Nausea And Vomiting   **ALLERGY IS TO STEROID INJECTIONS ONLY**        Medication List     TAKE these medications    Advil Dual Action 125-250 MG Tabs Generic drug: Ibuprofen-Acetaminophen Take 2 tablets by mouth daily as needed (pain).   alendronate 70 MG tablet Commonly known as: FOSAMAX Take 1 tablet (70 mg total) by mouth once a week. Take 30 minutes before the first food, beverage or medicine of the day with plain water   aspirin 81 MG chewable tablet Chew 1 tablet (81 mg total) by mouth 2 (two) times daily.   ezetimibe 10 MG tablet Commonly known as: ZETIA Take 10 mg by mouth every  evening.   LORazepam 0.5 MG tablet Commonly known as: Ativan Take 1 tablet (0.5 mg total) by mouth daily as needed.   methocarbamol 500 MG tablet Commonly known as: ROBAXIN Take 1 tablet (500 mg total) by mouth every 6 (six) hours as needed for muscle spasms.   ondansetron 4 MG tablet Commonly known as: ZOFRAN Take 1 tablet (4 mg total) by mouth every 6 (six) hours as needed for nausea.   oxyCODONE 5 MG immediate release tablet Commonly known as: Oxy IR/ROXICODONE Take 1-2 tablets (5-10 mg total) by mouth every 6 (six) hours as needed for moderate pain (pain score 4-6) (pain score 4-6).   PARoxetine 25 MG 24 hr tablet Commonly known as: PAXIL-CR Take 25 mg by mouth daily.   simvastatin 40 MG tablet Commonly known as: ZOCOR Take 40 mg by mouth every  evening.   Vitamin D 50 MCG (2000 UT) tablet Take 2,000 Units by mouth daily.               Durable Medical Equipment  (From admission, onward)           Start     Ordered   07/11/23 1135  DME 3 n 1  Once        07/11/23 1134   07/11/23 1135  DME Walker rolling  Once       Question Answer Comment  Walker: With 5 Inch Wheels   Patient needs a walker to treat with the following condition Status post total replacement of left hip      07/11/23 1134            Diagnostic Studies: DG Pelvis Portable  Result Date: 07/11/2023 CLINICAL DATA:  Status post left hip arthroplasty. EXAM: PORTABLE PELVIS 1-2 VIEWS COMPARISON:  AP pelvis 01/18/2022, pelvis and left hip radiographs 06/25/2023 FINDINGS: There is diffuse decreased bone mineralization. Interval total left hip arthroplasty. Redemonstration of total right hip arthroplasty. No perihardware lucency is seen to indicate hardware failure or loosening. Expected mild lateral left hip subcutaneous air. Lateral left hip surgical skin staples. Mild superior pubic symphysis joint space narrowing and peripheral spurring. Mild visualized bilateral inferior sacroiliac subchondral sclerosis. No acute fracture or dislocation. IMPRESSION: Interval total left hip arthroplasty without evidence of hardware failure. Electronically Signed   By: Neita Garnet M.D.   On: 07/11/2023 12:05   DG HIP UNILAT WITH PELVIS 1V LEFT  Result Date: 07/11/2023 CLINICAL DATA:  Left hip surgery. EXAM: DG HIP (WITH OR WITHOUT PELVIS) 1V*L* COMPARISON:  Pelvis and left hip radiographs 06/25/2023 FINDINGS: Images were performed intraoperatively without the presence of a radiologist. The patient is undergoing total left hip arthroplasty. Redemonstration of prior total right hip arthroplasty. No hardware complication is seen. Total fluoroscopy images: 3 Total fluoroscopy time: 17 seconds Total dose: Radiation Exposure Index (as provided by the fluoroscopic device): 1.94  mGy air Kerma Please see intraoperative findings for further detail. IMPRESSION: Intraoperative fluoroscopy for total left hip arthroplasty. Electronically Signed   By: Neita Garnet M.D.   On: 07/11/2023 12:04   DG C-Arm 1-60 Min-No Report  Result Date: 07/11/2023 Fluoroscopy was utilized by the requesting physician.  No radiographic interpretation.   MR Hip Left w/o contrast  Result Date: 07/04/2023 CLINICAL DATA:  Left hip pain.  Pain for 3 weeks.  No known injury. EXAM: MR OF THE LEFT HIP WITHOUT CONTRAST TECHNIQUE: Multiplanar, multisequence MR imaging was performed. No intravenous contrast was administered. COMPARISON:  None Available. FINDINGS: Bones: Right total hip  arthroplasty with resulting artifact partially obscuring the adjacent soft tissue and osseous structures. No periarticular fluid collection or osteolysis. No left hip dislocation. Subchondral linear low signal in the superior left femoral head with severe bone marrow edema throughout the femoral head and neck concerning for a nondisplaced, nondepressed subchondral insufficiency fracture versus less likely avascular necrosis. No aggressive osseous lesion. SI joints are normal. No SI joint widening or erosive changes. Partially visualized degenerative disease with disc height loss at L3-4 and L4-5. Articular cartilage and labrum Articular cartilage: Moderate partial-thickness cartilage loss of the left femoral head and acetabulum and subchondral marrow edema in the superior anterior acetabulum. Labrum: Degeneration of the left labrum with a anterosuperior labral tear. Joint or bursal effusion Joint effusion: Large left hip joint effusion. No SI joint effusion. Bursae:  No bursal fluid. Muscles and tendons Flexors: Normal. Extensors: Normal. Abductors: Normal. Adductors: Normal. Gluteals: Normal. Hamstrings: Small partial-thickness tear of the hamstring origins bilaterally. Other findings No pelvic free fluid. No fluid collection or  hematoma. No inguinal lymphadenopathy. No inguinal hernia. Partially visualized left renal cyst. IMPRESSION: 1. Subchondral linear low signal in the superior left femoral head with severe bone marrow edema throughout the femoral head and neck concerning for a nondisplaced, nondepressed subchondral insufficiency fracture versus less likely avascular necrosis. Large left hip joint effusion. 2. Moderate osteoarthritis of the left hip. 3. Degeneration of the left labrum with an anterosuperior labral tear. Electronically Signed   By: Elige Ko M.D.   On: 07/04/2023 10:33   XR HIP UNILAT W OR W/O PELVIS 2-3 VIEWS LEFT  Result Date: 06/25/2023 AP pelvis lateral view of the left hip: No acute fractures.  Both hips well located.  Status post right total hip arthroplasty well-seated components.  No acute findings.   Disposition: Discharge disposition: [facility]Another [facility] Care Institution Not Defined     The patient has a bed available at [facility] in the rehab facility.     Follow-up Information     Kathryne Hitch, MD Follow up in 2 week(s).   Specialty: Orthopedic Surgery Contact information: [address] [place] [address] [phone]                  Signed: Kathryne Hitch 07/12/2023, 11:18 AM

## 2023-07-14 ENCOUNTER — Non-Acute Institutional Stay (SKILLED_NURSING_FACILITY): Payer: Medicare Other | Admitting: Internal Medicine

## 2023-07-14 ENCOUNTER — Ambulatory Visit: Payer: Medicare Other | Admitting: Physician Assistant

## 2023-07-14 ENCOUNTER — Encounter: Payer: Self-pay | Admitting: Internal Medicine

## 2023-07-14 DIAGNOSIS — F419 Anxiety disorder, unspecified: Secondary | ICD-10-CM | POA: Diagnosis not present

## 2023-07-14 DIAGNOSIS — Z96642 Presence of left artificial hip joint: Secondary | ICD-10-CM | POA: Diagnosis not present

## 2023-07-14 DIAGNOSIS — E782 Mixed hyperlipidemia: Secondary | ICD-10-CM

## 2023-07-14 DIAGNOSIS — K5903 Drug induced constipation: Secondary | ICD-10-CM

## 2023-07-14 DIAGNOSIS — D62 Acute posthemorrhagic anemia: Secondary | ICD-10-CM

## 2023-07-14 DIAGNOSIS — M816 Localized osteoporosis [Lequesne]: Secondary | ICD-10-CM

## 2023-07-14 NOTE — Progress Notes (Signed)
Provider:   Location:  [facility] Room Number: 155A Place of Service:  SNF [phone]) Provider: Einar Crow ,MD  PCP: Thana Ates, MD Patient Care Team: Thana Ates, MD as PCP - General (Internal Medicine)  Extended Emergency Contact Information Primary Emergency Contact: Salley,ROBERT Address: [address], [place] [address] [place] Home Phone: [phone] Mobile Phone: [phone] Relation: Spouse  Code Status: Full Code Goals of Care: Advanced Directive information    07/14/2023    9:23 AM  Advanced Directives  Does Patient Have a Medical Advance Directive? Yes  Type of Estate agent of [place];Living will  Does patient want to make changes to medical advance directive? No - Patient declined  Copy of Healthcare Power of Attorney in Chart? Yes - validated most recent copy scanned in chart (See row information)      Chief Complaint  Patient presents with   New Admit To SNF    Patient is being seen for new admission   Immunizations    Patient is being seen for pneumonia and shingles vaccine   Health Maintenance    Patient is due for AWV, Hep c screening ,    HPI: Patient is a 77 y.o. female seen today for admission to Rehab for Therapy  [place] in [place] husband  Was Admitted from 10/25- 10/26 for Unilateral left Arthritis  Patient has been having worsening Left Hip pain for few weeks MRI showed ? Fracture /Avascular necrosis She underwent Left THA on 07/11/23  Patient has a history of osteoporosis and hyperlipidemia and anxiety  Right THA on 01/18/22  Patient is doing well Pain Seems Controlled Not walking without Assist    Past Medical History:  Diagnosis Date   Anxiety    Arthritis    Cancer (HCC)    melanoma   HDL deficiency    Pre-diabetes    Diet controlled   Past Surgical History:  Procedure Laterality Date   COLONOSCOPY     EYE  SURGERY Bilateral 2018   cateract   KNEE ARTHROPLASTY Right 08/2022   SHOULDER ARTHROSCOPY Right 1997   TONSILLECTOMY     age 7   TOTAL HIP ARTHROPLASTY Right 01/18/2022   Procedure: RIGHT TOTAL HIP ARTHROPLASTY ANTERIOR APPROACH;  Surgeon: Kathryne Hitch, MD;  Location: [facility];  Service: Orthopedics;  Laterality: Right;    reports that she has never smoked. She has never used smokeless tobacco. She reports current alcohol use of about 14.0 standard drinks of alcohol per week. She reports that she does not use drugs. Social History   Socioeconomic History   Marital status: Married    Spouse name: Not on file   Number of children: Not on file   Years of education: Not on file   Highest education level: Not on file  Occupational History   Not on file  Tobacco Use   Smoking status: Never   Smokeless tobacco: Never  Vaping Use   Vaping status: Never Used  Substance and Sexual Activity   Alcohol use: Yes    Alcohol/week: 14.0 standard drinks of alcohol    Types: 14 Glasses of wine per week   Drug use: Never   Sexual activity: Not on file  Other Topics Concern   Not on file  Social History Narrative   Not on file   Social Determinants of Health   Financial Resource  Strain: Not on file  Food Insecurity: No Food Insecurity (07/11/2023)   Hunger Vital Sign    Worried About Running Out of Food in the Last Year: Never true    Ran Out of Food in the Last Year: Never true  Transportation Needs: No Transportation Needs (07/11/2023)   PRAPARE - Administrator, Civil Service (Medical): No    Lack of Transportation (Non-Medical): No  Physical Activity: Not on file  Stress: Not on file  Social Connections: Not on file  Intimate Partner Violence: Not At Risk (07/11/2023)   Humiliation, Afraid, Rape, and Kick questionnaire    Fear of Current or Ex-Partner: No    Emotionally Abused: No    Physically Abused: No    Sexually Abused: No    Functional Status  Survey:    History reviewed. No pertinent family history.  Health Maintenance  Topic Date Due   Hepatitis C Screening  Never done   Pneumonia Vaccine 52+ Years old (1 of 1 - PCV) 09/20/2009   DEXA SCAN  Never done   Zoster Vaccines- Shingrix (2 of 2) 06/29/2019   Medicare Annual Wellness (AWV)  06/06/2021   DTaP/Tdap/Td (2 - Td or Tdap) 10/18/2027   INFLUENZA VACCINE  Completed   COVID-19 Vaccine  Completed   HPV VACCINES  Aged Out   Colonoscopy  Discontinued    Allergies  Allergen Reactions   Corticosteroids Nausea And Vomiting    **ALLERGY IS TO STEROID INJECTIONS ONLY**    Outpatient Encounter Medications as of 07/14/2023  Medication Sig   acetaminophen (TYLENOL) 500 MG tablet Take 1,000 mg by mouth 3 (three) times daily as needed for mild pain (pain score 1-3).   alendronate (FOSAMAX) 70 MG tablet Take 1 tablet (70 mg total) by mouth once a week. Take 30 minutes before the first food, beverage or medicine of the day with plain water   aspirin 81 MG chewable tablet Chew 1 tablet (81 mg total) by mouth 2 (two) times daily.   Cholecalciferol (VITAMIN D) 50 MCG (2000 UT) tablet Take 2,000 Units by mouth daily.   docusate sodium (COLACE) 100 MG capsule Take 100 mg by mouth 2 (two) times daily.   ezetimibe (ZETIA) 10 MG tablet Take 10 mg by mouth every evening.   LORazepam (ATIVAN) 0.5 MG tablet Take 1 tablet (0.5 mg total) by mouth daily as needed.   methocarbamol (ROBAXIN) 500 MG tablet Take 1 tablet (500 mg total) by mouth every 6 (six) hours as needed for muscle spasms.   ondansetron (ZOFRAN) 4 MG tablet Take 1 tablet (4 mg total) by mouth every 6 (six) hours as needed for nausea.   oxyCODONE (OXY IR/ROXICODONE) 5 MG immediate release tablet Take 1-2 tablets (5-10 mg total) by mouth every 6 (six) hours as needed for moderate pain (pain score 4-6) (pain score 4-6).   PARoxetine (PAXIL-CR) 25 MG 24 hr tablet Take 25 mg by mouth daily.   polyethylene glycol (MIRALAX / GLYCOLAX)  17 g packet Take 17 g by mouth 2 (two) times daily as needed.   simvastatin (ZOCOR) 40 MG tablet Take 40 mg by mouth every evening.   Ibuprofen-Acetaminophen (ADVIL DUAL ACTION) 125-250 MG TABS Take 2 tablets by mouth daily as needed (pain). (Patient not taking: Reported on 07/14/2023)   No facility-administered encounter medications on file as of 07/14/2023.    Review of Systems  Constitutional:  Negative for activity change and appetite change.  HENT: Negative.    Respiratory:  Negative  for cough and shortness of breath.   Cardiovascular:  Negative for leg swelling.  Gastrointestinal:  Positive for constipation.  Genitourinary: Negative.   Musculoskeletal:  Positive for arthralgias, gait problem and myalgias.  Skin: Negative.   Neurological:  Negative for dizziness and weakness.  Psychiatric/Behavioral:  Negative for confusion, dysphoric mood and sleep disturbance.     Vitals:   07/14/23 0915  BP: 123/67  Pulse: 75  Resp: 15  Temp: (!) 97.2 F (36.2 C)  TempSrc: Temporal  SpO2: 95%  Weight: 150 lb (68 kg)  Height: 5\' 4"  (1.626 m)   Body mass index is 25.75 kg/m. Physical Exam Vitals reviewed.  Constitutional:      Appearance: Normal appearance.  HENT:     Head: Normocephalic.     Nose: Nose normal.     Mouth/Throat:     Mouth: Mucous membranes are moist.     Pharynx: Oropharynx is clear.  Eyes:     Pupils: Pupils are equal, round, and reactive to light.  Cardiovascular:     Rate and Rhythm: Normal rate and regular rhythm.     Pulses: Normal pulses.     Heart sounds: Normal heart sounds. No murmur heard. Pulmonary:     Effort: Pulmonary effort is normal.     Breath sounds: Normal breath sounds.  Abdominal:     General: Abdomen is flat. Bowel sounds are normal.     Palpations: Abdomen is soft.  Musculoskeletal:        General: No swelling.     Cervical back: Neck supple.  Skin:    General: Skin is warm.  Neurological:     General: No focal deficit  present.     Mental Status: She is alert and oriented to person, place, and time.  Psychiatric:        Mood and Affect: Mood normal.        Thought Content: Thought content normal.     Labs reviewed: Basic Metabolic Panel: Recent Labs    07/10/23 1430 07/12/23 0354  NA 136 136  K 4.7 4.5  CL 102 104  CO2 25 25  GLUCOSE 102* 147*  BUN 26* 13  CREATININE 0.92 0.78  CALCIUM 9.4 8.4*   Liver Function Tests: Recent Labs    07/10/23 1430  AST 23  ALT 23  ALKPHOS 79  BILITOT 0.7  PROT 7.7  ALBUMIN 4.3   No results for input(s): "LIPASE", "AMYLASE" in the last 8760 hours. No results for input(s): "AMMONIA" in the last 8760 hours. CBC: Recent Labs    07/10/23 1430 07/12/23 0354  WBC 10.0 10.8*  HGB 12.7 9.7*  HCT 39.4 30.1*  MCV 92.7 93.2  PLT 335 229   Cardiac Enzymes: No results for input(s): "CKTOTAL", "CKMB", "CKMBINDEX", "TROPONINI" in the last 8760 hours. BNP: Invalid input(s): "POCBNP" Lab Results  Component Value Date   HGBA1C 6.3 (H) 01/09/2022   No results found for: "TSH" No results found for: "VITAMINB12" No results found for: "FOLATE" No results found for: "IRON", "TIBC", "FERRITIN"  Imaging and Procedures obtained prior to SNF admission: DG Pelvis Portable  Result Date: 07/11/2023 CLINICAL DATA:  Status post left hip arthroplasty. EXAM: PORTABLE PELVIS 1-2 VIEWS COMPARISON:  AP pelvis 01/18/2022, pelvis and left hip radiographs 06/25/2023 FINDINGS: There is diffuse decreased bone mineralization. Interval total left hip arthroplasty. Redemonstration of total right hip arthroplasty. No perihardware lucency is seen to indicate hardware failure or loosening. Expected mild lateral left hip subcutaneous air. Lateral left hip  surgical skin staples. Mild superior pubic symphysis joint space narrowing and peripheral spurring. Mild visualized bilateral inferior sacroiliac subchondral sclerosis. No acute fracture or dislocation. IMPRESSION: Interval total  left hip arthroplasty without evidence of hardware failure. Electronically Signed   By: Neita Garnet M.D.   On: 07/11/2023 12:05   DG HIP UNILAT WITH PELVIS 1V LEFT  Result Date: 07/11/2023 CLINICAL DATA:  Left hip surgery. EXAM: DG HIP (WITH OR WITHOUT PELVIS) 1V*L* COMPARISON:  Pelvis and left hip radiographs 06/25/2023 FINDINGS: Images were performed intraoperatively without the presence of a radiologist. The patient is undergoing total left hip arthroplasty. Redemonstration of prior total right hip arthroplasty. No hardware complication is seen. Total fluoroscopy images: 3 Total fluoroscopy time: 17 seconds Total dose: Radiation Exposure Index (as provided by the fluoroscopic device): 1.94 mGy air Kerma Please see intraoperative findings for further detail. IMPRESSION: Intraoperative fluoroscopy for total left hip arthroplasty. Electronically Signed   By: Neita Garnet M.D.   On: 07/11/2023 12:04   DG C-Arm 1-60 Min-No Report  Result Date: 07/11/2023 Fluoroscopy was utilized by the requesting physician.  No radiographic interpretation.    Assessment/Plan 1. History of total left hip replacement Pain Controlled WBAT Can walk with Walker with Assist On Aspirin Follow up with Ortho Working with therapy  2. Mixed hyperlipidemia On statin  3. Localized osteoporosis without current pathological fracture Fosamax Follow with her PCP  4. Anxiety Ativan PRN Also on Paxil  5. Drug-induced constipation Miralax  6. Acute blood loss anemia Follow as Outpatient    Family/ staff Communication:   Labs/tests ordered:

## 2023-07-18 LAB — CBC AND DIFFERENTIAL
HCT: 27 — AB (ref 36–46)
Hemoglobin: 9 — AB (ref 12.0–16.0)
Platelets: 471 10*3/uL — AB (ref 150–400)
WBC: 7.6

## 2023-07-18 LAB — CBC: RBC: 2.95 — AB (ref 3.87–5.11)

## 2023-07-21 ENCOUNTER — Encounter: Payer: Self-pay | Admitting: Adult Health

## 2023-07-21 ENCOUNTER — Other Ambulatory Visit (HOSPITAL_BASED_OUTPATIENT_CLINIC_OR_DEPARTMENT_OTHER): Payer: Self-pay

## 2023-07-21 ENCOUNTER — Non-Acute Institutional Stay (SKILLED_NURSING_FACILITY): Payer: Medicare Other | Admitting: Adult Health

## 2023-07-21 DIAGNOSIS — M816 Localized osteoporosis [Lequesne]: Secondary | ICD-10-CM | POA: Diagnosis not present

## 2023-07-21 DIAGNOSIS — D62 Acute posthemorrhagic anemia: Secondary | ICD-10-CM | POA: Diagnosis not present

## 2023-07-21 DIAGNOSIS — M1612 Unilateral primary osteoarthritis, left hip: Secondary | ICD-10-CM | POA: Diagnosis not present

## 2023-07-21 DIAGNOSIS — K5903 Drug induced constipation: Secondary | ICD-10-CM

## 2023-07-21 MED ORDER — FERROUS SULFATE 325 (65 FE) MG PO TABS
ORAL_TABLET | ORAL | 0 refills | Status: DC
  Filled 2023-07-21: qty 100, 233d supply, fill #0

## 2023-07-21 NOTE — Progress Notes (Signed)
Location:  [facility] Room Number: 155A Place of Service:  SNF (31)  Provider: Tally Joe  PCP: Thana Ates, MD Patient Care Team: Thana Ates, MD as PCP - General (Internal Medicine)  Extended Emergency Contact Information Primary Emergency Contact: Stefanick,ROBERT Address: [address]          [place], [place] [address] [place] Home Phone: [phone] Mobile Phone: [phone] Relation: Spouse  Code Status: Full Code Goals of care:  Advanced Directive information    07/21/2023    8:50 AM  Advanced Directives  Does Patient Have a Medical Advance Directive? Yes  Type of Estate agent of [place];Living will  Does patient want to make changes to medical advance directive? No - Patient declined  Copy of Healthcare Power of Attorney in Chart? Yes - validated most recent copy scanned in chart (See row information)     Allergies  Allergen Reactions   Corticosteroids Nausea And Vomiting    **ALLERGY IS TO STEROID INJECTIONS ONLY**    Chief Complaint  Patient presents with   Discharge Note    Patient is being seen for Discharge    HPI:  77 y.o. female seen for discharge from [facility] back [place].   Admitted to the hospital 07/11/23-07/12/23 a left toal hip arthroplasty anterior approach. MRI on 07/04/23 showed:  1. Subchondral linear low signal in the superior left femoral head with severe bone marrow edema throughout the femoral head and neck concerning for a nondisplaced, nondepressed subchondral insufficiency fracture versus less likely avascular necrosis. Large left hip joint effusion. 2. Moderate osteoarthritis of the left hip. 3. Degeneration of the left labrum with an anterosuperior labral tear.   She has worked with therapy and is independent with adls and requesting a discharge.  Eating and drinking well and going to the BR. Only using tylenol and robaxin for pain  which is well controlled.   Hgb 9.0 07/18/23, 9.7 07/12/23 Baseline 13  Past Medical History:  Diagnosis Date   Anxiety    Arthritis    Cancer (HCC)    melanoma   HDL deficiency    Pre-diabetes    Diet controlled    Past Surgical History:  Procedure Laterality Date   COLONOSCOPY     EYE SURGERY Bilateral 2018   cateract   KNEE ARTHROPLASTY Right 08/2022   SHOULDER ARTHROSCOPY Right 1997   TONSILLECTOMY     age 56   TOTAL HIP ARTHROPLASTY Right 01/18/2022   Procedure: RIGHT TOTAL HIP ARTHROPLASTY ANTERIOR APPROACH;  Surgeon: Kathryne Hitch, MD;  Location: [facility];  Service: Orthopedics;  Laterality: Right;   TOTAL HIP ARTHROPLASTY Left 07/11/2023   Procedure: LEFT TOTAL HIP ARTHROPLASTY ANTERIOR APPROACH;  Surgeon: Kathryne Hitch, MD;  Location: [facility];  Service: Orthopedics;  Laterality: Left;      reports that she has never smoked. She has never used smokeless tobacco. She reports current alcohol use of about 14.0 standard drinks of alcohol per week. She reports that she does not use drugs. Social History   Socioeconomic History   Marital status: Married    Spouse name: Not on file   Number of children: Not on file   Years of education: Not on file   Highest education level: Not on file  Occupational History   Not on file  Tobacco Use   Smoking status: Never   Smokeless tobacco: Never  Vaping Use   Vaping status: Never Used  Substance and Sexual Activity   Alcohol use: Yes    Alcohol/week: 14.0 standard drinks of alcohol    Types: 14 Glasses of wine per week   Drug use: Never   Sexual activity: Not on file  Other Topics Concern   Not on file  Social History Narrative   Not on file   Social Determinants of Health   Financial Resource Strain: Not on file  Food Insecurity: No Food Insecurity (07/11/2023)   Hunger Vital Sign    Worried About Running Out of Food in the Last Year: Never true    Ran Out of Food in the Last Year: Never true   Transportation Needs: No Transportation Needs (07/11/2023)   PRAPARE - Administrator, Civil Service (Medical): No    Lack of Transportation (Non-Medical): No  Physical Activity: Not on file  Stress: Not on file  Social Connections: Not on file  Intimate Partner Violence: Not At Risk (07/11/2023)   Humiliation, Afraid, Rape, and Kick questionnaire    Fear of Current or Ex-Partner: No    Emotionally Abused: No    Physically Abused: No    Sexually Abused: No   Functional Status Survey:    Allergies  Allergen Reactions   Corticosteroids Nausea And Vomiting    **ALLERGY IS TO STEROID INJECTIONS ONLY**    Pertinent  Health Maintenance Due  Topic Date Due   DEXA SCAN  Never done   INFLUENZA VACCINE  Completed   Colonoscopy  Discontinued    Medications: Outpatient Encounter Medications as of 07/21/2023  Medication Sig   acetaminophen (TYLENOL) 500 MG tablet Take 1,000 mg by mouth 3 (three) times daily as needed for mild pain (pain score 1-3).   alendronate (FOSAMAX) 70 MG tablet Take 1 tablet (70 mg total) by mouth once a week. Take 30 minutes before the first food, beverage or medicine of the day with plain water   aspirin 81 MG chewable tablet Chew 1 tablet (81 mg total) by mouth 2 (two) times daily.   Cholecalciferol (VITAMIN D) 50 MCG (2000 UT) tablet Take 2,000 Units by mouth daily.   docusate sodium (COLACE) 100 MG capsule Take 100 mg by mouth 2 (two) times daily.   ezetimibe (ZETIA) 10 MG tablet Take 10 mg by mouth every evening.   LORazepam (ATIVAN) 0.5 MG tablet Take 1 tablet (0.5 mg total) by mouth daily as needed.   methocarbamol (ROBAXIN) 500 MG tablet Take 1 tablet (500 mg total) by mouth every 6 (six) hours as needed for muscle spasms.   ondansetron (ZOFRAN) 4 MG tablet Take 1 tablet (4 mg total) by mouth every 6 (six) hours as needed for nausea.   PARoxetine (PAXIL-CR) 25 MG 24 hr tablet Take 25 mg by mouth daily.   polyethylene glycol (MIRALAX /  GLYCOLAX) 17 g packet Take 17 g by mouth 2 (two) times daily as needed.   simvastatin (ZOCOR) 40 MG tablet Take 40 mg by mouth every evening.   oxyCODONE (OXY IR/ROXICODONE) 5 MG immediate release tablet Take 1-2 tablets (5-10 mg total) by mouth every 6 (six) hours as needed for moderate pain (pain score 4-6) (pain score 4-6).   No facility-administered encounter medications on file as of 07/21/2023.    Review of Systems  Constitutional:  Positive for activity change. Negative for appetite change, chills, diaphoresis, fatigue, fever and unexpected weight change.  HENT:  Negative for congestion.   Respiratory:  Negative for cough, shortness of breath and wheezing.  Cardiovascular:  Positive for leg swelling. Negative for chest pain and palpitations.  Gastrointestinal:  Negative for abdominal distention, abdominal pain, constipation and diarrhea.  Genitourinary:  Negative for difficulty urinating and dysuria.  Musculoskeletal:  Positive for gait problem. Negative for arthralgias, back pain, joint swelling and myalgias.  Neurological:  Negative for dizziness, tremors, seizures, syncope, facial asymmetry, speech difficulty, weakness, light-headedness, numbness and headaches.  Psychiatric/Behavioral:  Negative for agitation, behavioral problems and confusion.     Vitals:   07/21/23 0845  BP: 122/66  Pulse: (!) 58  Resp: 12  Temp: 98.5 F (36.9 C)  TempSrc: Temporal  SpO2: 97%  Weight: 157 lb 3.2 oz (71.3 kg)  Height: 5\' 4"  (1.626 m)   Body mass index is 26.98 kg/m. Physical Exam Vitals and nursing note reviewed.  Constitutional:      General: She is not in acute distress.    Appearance: She is not diaphoretic.  HENT:     Head: Normocephalic and atraumatic.  Neck:     Vascular: No JVD.  Cardiovascular:     Rate and Rhythm: Normal rate and regular rhythm.     Heart sounds: No murmur heard. Pulmonary:     Effort: Pulmonary effort is normal. No respiratory distress.     Breath  sounds: Normal breath sounds. No wheezing.  Abdominal:     General: Bowel sounds are normal. There is no distension.     Palpations: Abdomen is soft.     Tenderness: There is no abdominal tenderness.  Musculoskeletal:     Right lower leg: No edema.     Left lower leg: Edema present.  Skin:    General: Skin is warm and dry.     Comments: Dressing left hip CDI   Neurological:     Mental Status: She is alert and oriented to person, place, and time.     Labs reviewed: Basic Metabolic Panel: Recent Labs    07/10/23 1430 07/12/23 0354  NA 136 136  K 4.7 4.5  CL 102 104  CO2 25 25  GLUCOSE 102* 147*  BUN 26* 13  CREATININE 0.92 0.78  CALCIUM 9.4 8.4*   Liver Function Tests: Recent Labs    07/10/23 1430  AST 23  ALT 23  ALKPHOS 79  BILITOT 0.7  PROT 7.7  ALBUMIN 4.3   No results for input(s): "LIPASE", "AMYLASE" in the last 8760 hours. No results for input(s): "AMMONIA" in the last 8760 hours. CBC: Recent Labs    07/10/23 1430 07/12/23 0354  WBC 10.0 10.8*  HGB 12.7 9.7*  HCT 39.4 30.1*  MCV 92.7 93.2  PLT 335 229   Cardiac Enzymes: No results for input(s): "CKTOTAL", "CKMB", "CKMBINDEX", "TROPONINI" in the last 8760 hours. BNP: Invalid input(s): "POCBNP" CBG: No results for input(s): "GLUCAP" in the last 8760 hours.  Procedures and Imaging Studies During Stay: DG Pelvis Portable  Result Date: 07/11/2023 CLINICAL DATA:  Status post left hip arthroplasty. EXAM: PORTABLE PELVIS 1-2 VIEWS COMPARISON:  AP pelvis 01/18/2022, pelvis and left hip radiographs 06/25/2023 FINDINGS: There is diffuse decreased bone mineralization. Interval total left hip arthroplasty. Redemonstration of total right hip arthroplasty. No perihardware lucency is seen to indicate hardware failure or loosening. Expected mild lateral left hip subcutaneous air. Lateral left hip surgical skin staples. Mild superior pubic symphysis joint space narrowing and peripheral spurring. Mild visualized  bilateral inferior sacroiliac subchondral sclerosis. No acute fracture or dislocation. IMPRESSION: Interval total left hip arthroplasty without evidence of hardware failure. Electronically Signed  By: Neita Garnet M.D.   On: 07/11/2023 12:05   DG HIP UNILAT WITH PELVIS 1V LEFT  Result Date: 07/11/2023 CLINICAL DATA:  Left hip surgery. EXAM: DG HIP (WITH OR WITHOUT PELVIS) 1V*L* COMPARISON:  Pelvis and left hip radiographs 06/25/2023 FINDINGS: Images were performed intraoperatively without the presence of a radiologist. The patient is undergoing total left hip arthroplasty. Redemonstration of prior total right hip arthroplasty. No hardware complication is seen. Total fluoroscopy images: 3 Total fluoroscopy time: 17 seconds Total dose: Radiation Exposure Index (as provided by the fluoroscopic device): 1.94 mGy air Kerma Please see intraoperative findings for further detail. IMPRESSION: Intraoperative fluoroscopy for total left hip arthroplasty. Electronically Signed   By: Neita Garnet M.D.   On: 07/11/2023 12:04   DG C-Arm 1-60 Min-No Report  Result Date: 07/11/2023 Fluoroscopy was utilized by the requesting physician.  No radiographic interpretation.   MR Hip Left w/o contrast  Result Date: 07/04/2023 CLINICAL DATA:  Left hip pain.  Pain for 3 weeks.  No known injury. EXAM: MR OF THE LEFT HIP WITHOUT CONTRAST TECHNIQUE: Multiplanar, multisequence MR imaging was performed. No intravenous contrast was administered. COMPARISON:  None Available. FINDINGS: Bones: Right total hip arthroplasty with resulting artifact partially obscuring the adjacent soft tissue and osseous structures. No periarticular fluid collection or osteolysis. No left hip dislocation. Subchondral linear low signal in the superior left femoral head with severe bone marrow edema throughout the femoral head and neck concerning for a nondisplaced, nondepressed subchondral insufficiency fracture versus less likely avascular necrosis. No  aggressive osseous lesion. SI joints are normal. No SI joint widening or erosive changes. Partially visualized degenerative disease with disc height loss at L3-4 and L4-5. Articular cartilage and labrum Articular cartilage: Moderate partial-thickness cartilage loss of the left femoral head and acetabulum and subchondral marrow edema in the superior anterior acetabulum. Labrum: Degeneration of the left labrum with a anterosuperior labral tear. Joint or bursal effusion Joint effusion: Large left hip joint effusion. No SI joint effusion. Bursae:  No bursal fluid. Muscles and tendons Flexors: Normal. Extensors: Normal. Abductors: Normal. Adductors: Normal. Gluteals: Normal. Hamstrings: Small partial-thickness tear of the hamstring origins bilaterally. Other findings No pelvic free fluid. No fluid collection or hematoma. No inguinal lymphadenopathy. No inguinal hernia. Partially visualized left renal cyst. IMPRESSION: 1. Subchondral linear low signal in the superior left femoral head with severe bone marrow edema throughout the femoral head and neck concerning for a nondisplaced, nondepressed subchondral insufficiency fracture versus less likely avascular necrosis. Large left hip joint effusion. 2. Moderate osteoarthritis of the left hip. 3. Degeneration of the left labrum with an anterosuperior labral tear. Electronically Signed   By: Elige Ko M.D.   On: 07/04/2023 10:33   XR HIP UNILAT W OR W/O PELVIS 2-3 VIEWS LEFT  Result Date: 06/25/2023 AP pelvis lateral view of the left hip: No acute fractures.  Both hips well located.  Status post right total hip arthroplasty well-seated components.  No acute findings.   Assessment/Plan:    1. Acute blood loss anemia (ABLA)  - ferrous sulfate 325 (65 FE) MG tablet; Take by mouth 3 (three) times a week.  X 1 month  F/U with PCP    2. Unilateral primary osteoarthritis, left hip S/p left hip arthroplasty F/u with Dr Magnus Ivan Doing well with therapy [facility] for  discharge.   3. Localized osteoporosis without current pathological fracture On fosamax Recommend discussing continued use with her PCP   4. Drug-induced constipation Controlled with current mediation  Patient has been advised to f/u with their PCP in 1-2 weeks to for a transitions of care visit.  Social services at their facility was responsible for arranging this appointment.  Pt was provided with adequate prescriptions of noncontrolled medications to reach the scheduled appointment .  For controlled substances, a limited supply was provided as appropriate for the individual patient.  If the pt normally receives these medications from a pain clinic or has a contract with another physician, these medications should be received from that clinic or physician only).    Future labs/tests needed:  CBC

## 2023-07-24 ENCOUNTER — Ambulatory Visit: Payer: Medicare Other | Admitting: Orthopaedic Surgery

## 2023-07-24 DIAGNOSIS — Z96642 Presence of left artificial hip joint: Secondary | ICD-10-CM

## 2023-07-24 LAB — CBC AND DIFFERENTIAL
HCT: 32 — AB (ref 36–46)
Hemoglobin: 10.7 — AB (ref 12.0–16.0)
Platelets: 656 10*3/uL — AB (ref 150–400)
WBC: 9.4

## 2023-07-24 LAB — CBC: RBC: 3.58 — AB (ref 3.87–5.11)

## 2023-07-24 NOTE — Progress Notes (Signed)
The patient is here for first postoperative visit status post a left total hip arthroplasty.  We had replaced her right hip in the past and that had done well.  She then developed acute worsening left hip pain over the last several weeks to months.  It was determined that she had a insufficiency fracture based on MRI and a hip replacement of the left hip was more acutely warranted.  She has been compliant with a baby aspirin twice a day.  She is also been wearing her compressive hose.  She is ambulate with a walker.  She is not taking any narcotics.  Her hemoglobin was a little low so they did put her on iron but she is not having any low blood pressure.  Her left hip incision looks good.  The staples were removed and Steri-Strips applied.  Her leg lengths seem equal.  She has no calf pain and no foot and ankle swelling.  She will continue to slowly increase her activities as comfort allows.  I would like to see her back in a month to see how she is doing overall from a mobility standpoint but no x-rays are needed.

## 2023-07-25 ENCOUNTER — Encounter: Payer: Self-pay | Admitting: Adult Health

## 2023-08-21 ENCOUNTER — Ambulatory Visit: Payer: Medicare Other | Admitting: Orthopaedic Surgery

## 2023-08-21 ENCOUNTER — Encounter: Payer: Self-pay | Admitting: Orthopaedic Surgery

## 2023-08-21 DIAGNOSIS — Z96642 Presence of left artificial hip joint: Secondary | ICD-10-CM

## 2023-08-21 NOTE — Progress Notes (Signed)
The patient is here at 6 weeks status post a left total hip replacement.  We replaced her right hip in May 2023.  She is active 77 year old female and is doing great.  She has no issues other than a little bit of swelling at her incision.  She walks with a normal gait.  Her leg lengths appear equal.  She has a little bit of pain with rotation of her left hip but overall she looks good.  From my standpoint we will see her back in 3 months to see how she is doing overall.  She can get back in the water aerobics as she likes.  If she has any issues before 3 months they will let us know.  At her next visit we will have a standing AP pelvis.

## 2023-09-06 IMAGING — MR MR PELVIS W/O CM
4 of 5 series · 26 of 48 positions shown · non-contrast
Comparison: CT abdomen and pelvis 11/01/2017

CLINICAL DATA: Bilateral buttocks pain

EXAM:
MRI PELVIS WITHOUT CONTRAST
TECHNIQUE: Multiplanar multisequence MR imaging of the pelvis was performed. No
intravenous contrast was administered.

[Series 3: STIR · coronal · right · 3.0mm · 1.25mm/px · 8 of 35 slices shown]
[im 1/35]
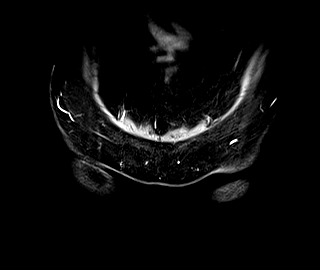
[im 5/35]
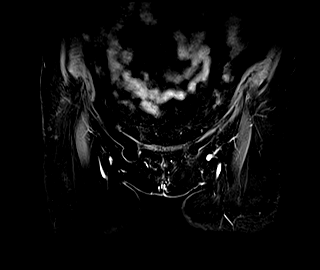
[im 10/35]
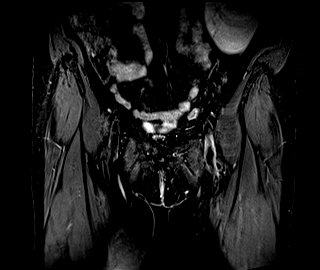
[im 15/35]
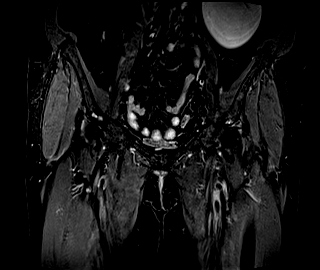
[im 20/35]
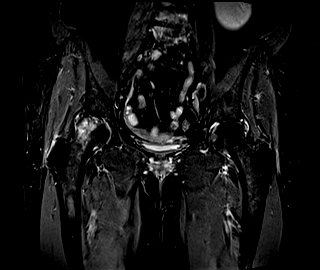
[im 25/35]
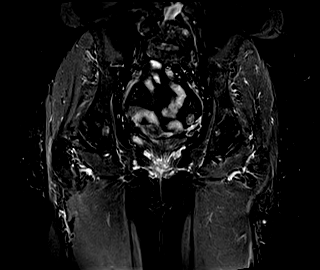
[im 30/35]
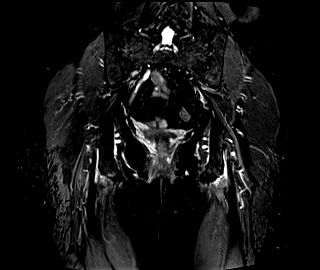
[im 35/35]
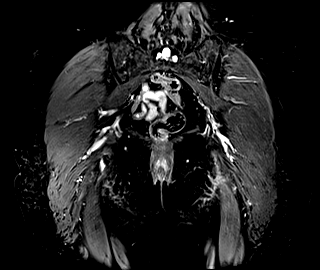

[Series 4: T1 · coronal · right · 3.0mm · 0.78mm/px · 9 of 35 slices shown (1 of 2)]
[im 1/35]
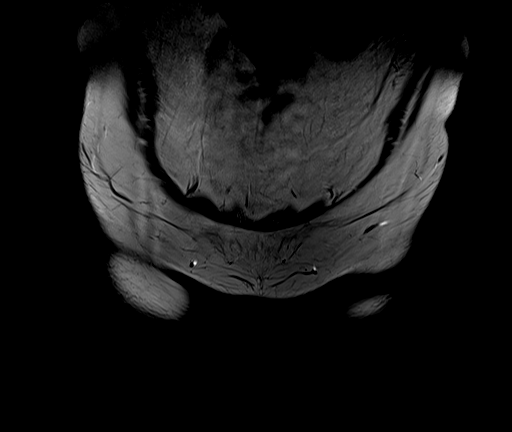
[im 5/35]
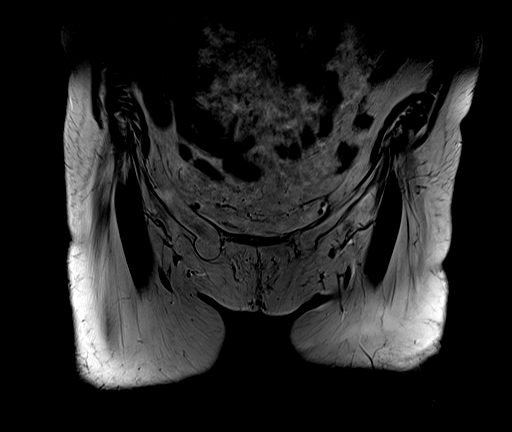
[im 9/35]
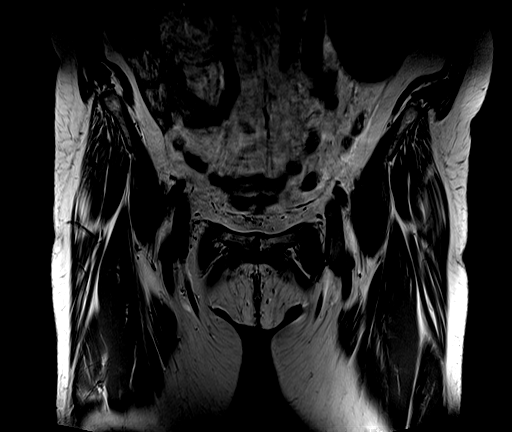
[im 13/35]
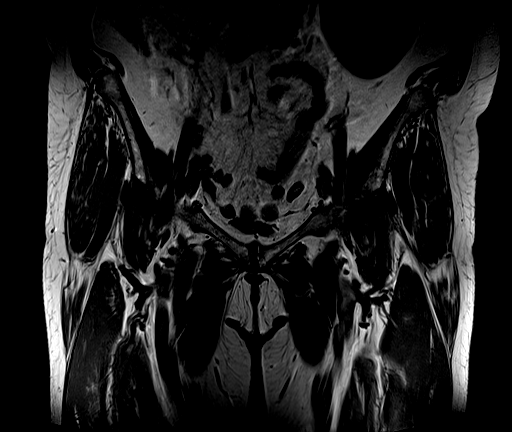
[im 18/35]
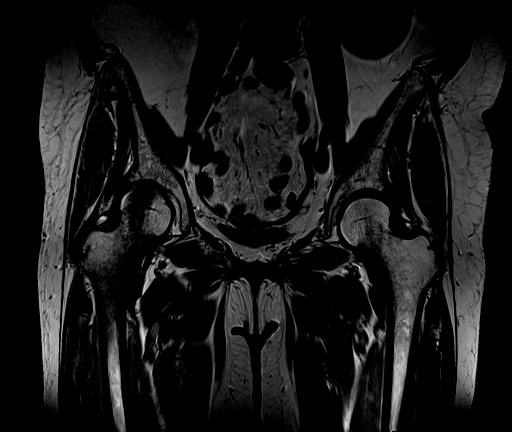
[im 22/35]
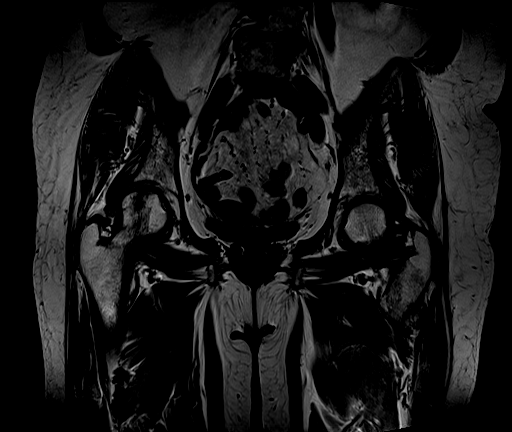
[im 26/35]
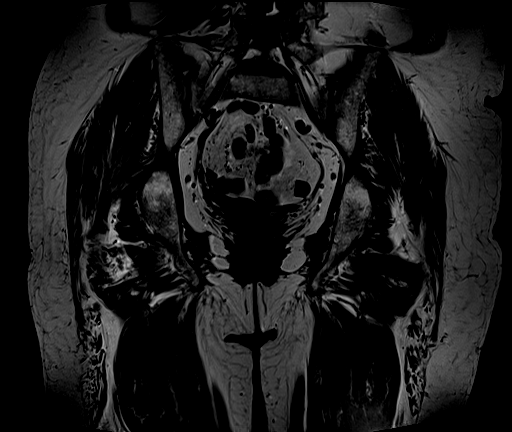
[im 30/35]
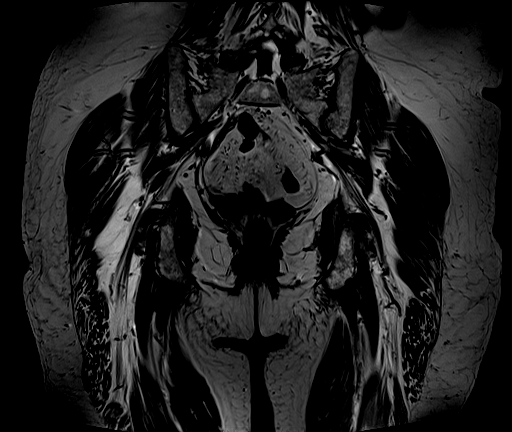
[im 35/35]
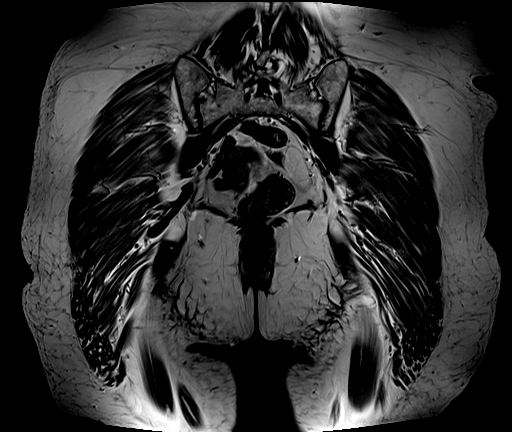

[Series 5: T1 · axial · right · 5.0mm · 0.78mm/px · z∈[-63,+175]mm · 6 of 40 slices shown (2 of 2)]
[im 1/40]
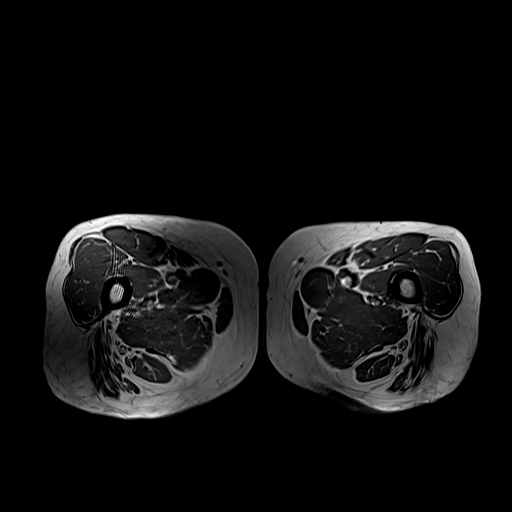
[im 5/40]
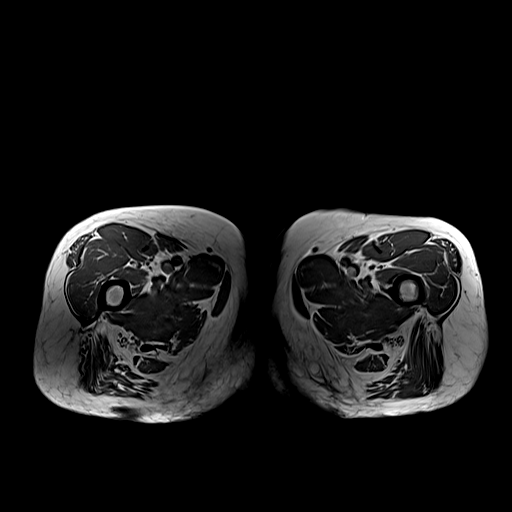
[im 14/40]
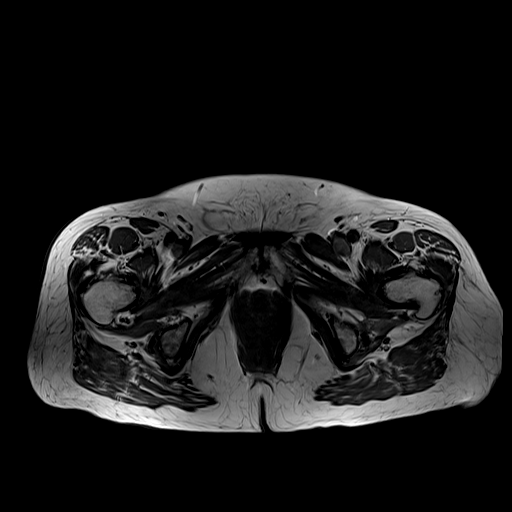
[im 18/40]
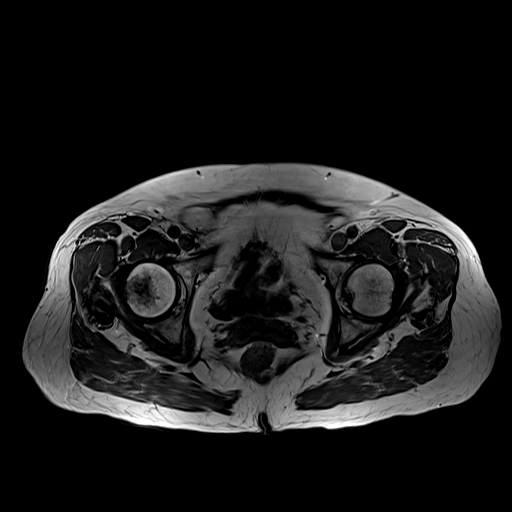
[im 22/40]
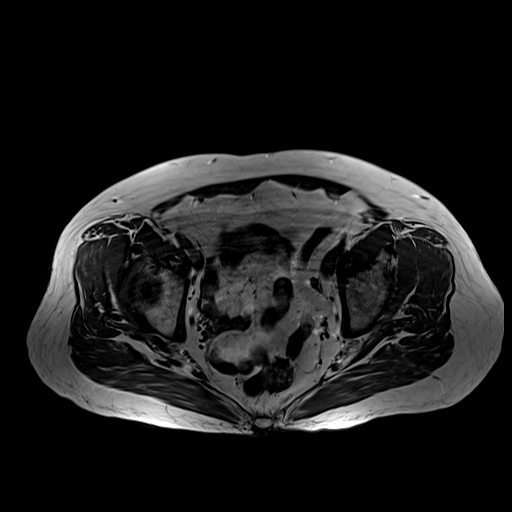
[im 35/40]
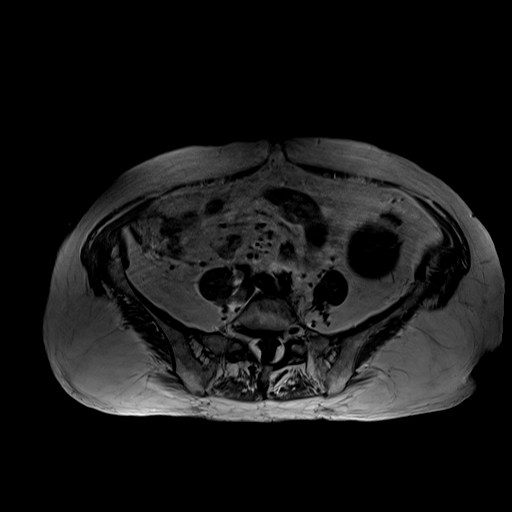

[Series 6: T2 fat-sat · axial · right · 5.0mm · 0.78mm/px · z∈[-35,+175]mm · 3 of 40 slices shown]
[im 5/40]
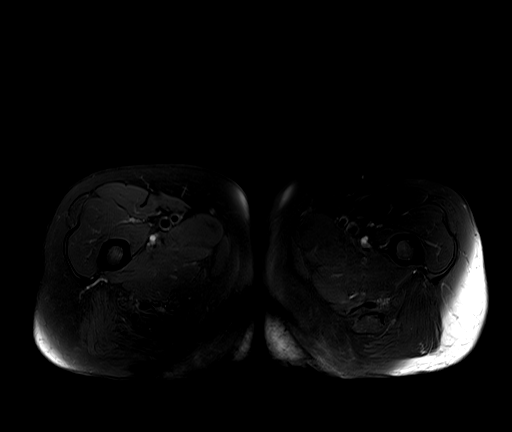
[im 22/40]
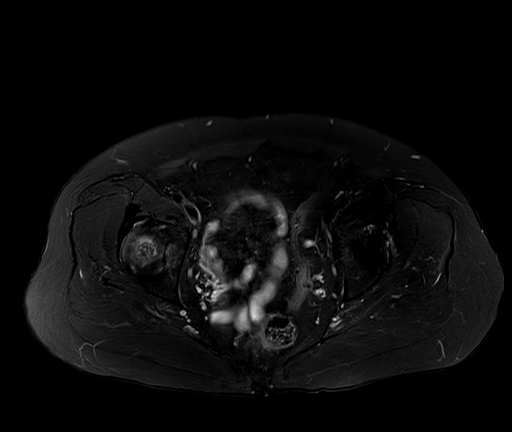
[im 35/40]
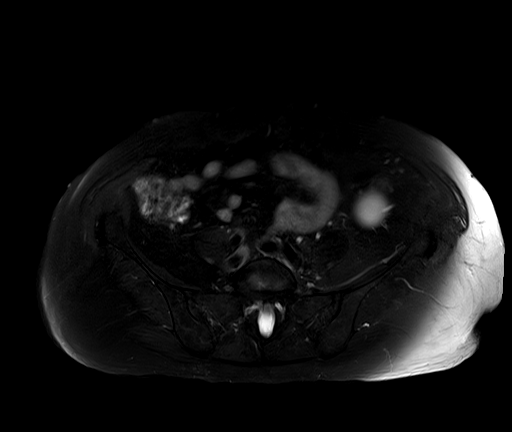

[26 of 48 positions shown; findings below may reference images not displayed]

FINDINGS: Urinary Tract: Urinary bladder is incompletely distended with no
obvious mass visualized. Urethra appears grossly normal. Large left
renal cyst partially visualized measuring at least 7.8 cm, as seen
on previous CT.

Bowel:  Visualized bowel is unremarkable.

Vascular/Lymphatic: No pathologically enlarged lymph nodes. No
significant vascular abnormality seen.

Reproductive:  No mass or other significant abnormality

Other:  No ascites.

Musculoskeletal: Degenerative changes in the lower lumbar spine at
L4-L5 with associated bone marrow edema. Severe degenerative changes
of the right hip noted with joint space loss, mild acetabular
subchondral cysts and edema, and extensive reactive bone marrow
edema in the femoral head and neck.

Moderate to severe tendinosis of the proximal right hamstring
tendons with partial tearing. Severe tendinosis of the left proximal
hamstring tendons with high-grade partial tearing. Small lobulated
fluid in the adjacent soft tissues adjacent to the ischial
tuberosities as well as mild adjacent soft tissue edema on the left.
IMPRESSION: 1. Severe tendinosis of the left proximal hamstring tendons with
high-grade partial tearing. Moderate to severe tendinosis of the
proximal right hamstring tendons with partial tearing.
2. Small fluid collections adjacent to the ischial tuberosities may
represent reactive fluid or mild ischial bursitis. There is also
mild surrounding soft tissue edema on the left.
3. Severe degenerative changes of the right hip.
4. Other findings as described.

## 2023-09-22 ENCOUNTER — Other Ambulatory Visit (HOSPITAL_BASED_OUTPATIENT_CLINIC_OR_DEPARTMENT_OTHER): Payer: Self-pay

## 2023-11-20 ENCOUNTER — Encounter: Payer: Self-pay | Admitting: Orthopaedic Surgery

## 2023-11-20 ENCOUNTER — Ambulatory Visit: Payer: Medicare Other | Admitting: Orthopaedic Surgery

## 2023-11-20 ENCOUNTER — Ambulatory Visit (INDEPENDENT_AMBULATORY_CARE_PROVIDER_SITE_OTHER): Payer: Self-pay

## 2023-11-20 DIAGNOSIS — Z96642 Presence of left artificial hip joint: Secondary | ICD-10-CM

## 2023-11-20 NOTE — Progress Notes (Signed)
 The patient is here at 4-1/77-month status post a left total hip replacement.  We replaced her right hip and 2023.  She is very active at age 78.  She says she is a little bit of groin pain and did have a popping sensation that it occurred a while ago.  She does get in a pool and has backed off some of the work and exercises she was doing in terms of yard work.  She is walking without assistive device and no limp.  Both hips move smoothly and fluidly.  There is no blocks or rotation of the left side.  It feels stable on my exam.  AP pelvis shows both hips are well-seated with no complicating features.  There is some calcification lateral to the femoral head and acetabular component on the left side that may have represented or heterotopic ossification or tendon tear but I gave her reassurance that there is nothing to do about this and that we will continue to heal with time.  She does need to go slow if she has any symptoms but there has been no symptoms of instability other than the popping sensation that she had had.  That has calmed down.  She will continue to increase her activity as comfort allows.  Will see her back for final visit in 6 months with final standing AP pelvis.

## 2023-12-13 ENCOUNTER — Other Ambulatory Visit (HOSPITAL_BASED_OUTPATIENT_CLINIC_OR_DEPARTMENT_OTHER): Payer: Self-pay

## 2024-03-05 ENCOUNTER — Other Ambulatory Visit (HOSPITAL_BASED_OUTPATIENT_CLINIC_OR_DEPARTMENT_OTHER): Payer: Self-pay

## 2024-05-12 ENCOUNTER — Ambulatory Visit: Admitting: Physician Assistant

## 2024-05-12 ENCOUNTER — Other Ambulatory Visit (INDEPENDENT_AMBULATORY_CARE_PROVIDER_SITE_OTHER)

## 2024-05-12 DIAGNOSIS — M7631 Iliotibial band syndrome, right leg: Secondary | ICD-10-CM | POA: Diagnosis not present

## 2024-05-12 DIAGNOSIS — Z96642 Presence of left artificial hip joint: Secondary | ICD-10-CM | POA: Diagnosis not present

## 2024-05-12 DIAGNOSIS — M7051 Other bursitis of knee, right knee: Secondary | ICD-10-CM

## 2024-05-12 DIAGNOSIS — Z7409 Other reduced mobility: Secondary | ICD-10-CM | POA: Diagnosis not present

## 2024-05-12 NOTE — Progress Notes (Signed)
 Office Visit Note   Patient: Nichole [name]           Date of Birth: [birth year]           MRN: [id number] Visit Date: 05/12/2024              Requested by: Dwight Trula SQUIBB, MD [address] [place],  [place] [address] PCP: Dwight Trula SQUIBB, MD   Assessment & Plan: Visit Diagnoses:  1. History of left hip replacement   2. Impaired functional mobility, balance, gait, and endurance   3. It band syndrome, right   4. Pes anserinus bursitis of right knee     Plan:  Will send her to formal therapy for gait balance training, IT band stretching, hamstring stretching, core strengthening, also to address her the pes anserinus.  They will include modalities and home exercise program.  See her back in 6 weeks see how she is doing overall.  She will follow-up sooner if she develops any saddle anesthesia like symptoms, radicular symptoms down either leg or weakness.  She can continue her pool exercises and gym exercises she is to avoid abduction exercises of both hips.  Follow-Up Instructions: Return in about 6 weeks (around 06/23/2024).   Orders:  Orders Placed This Encounter  Procedures   XR Pelvis 1-2 Views   No orders of the defined types were placed in this encounter.     Procedures: No procedures performed   Clinical Data: No additional findings.   Subjective: Chief Complaint  Patient presents with   Right Hip - Pain    01/18/22 RT THA   Left Hip - Follow-up    07/11/23 LT THA    HPI Nichole [name] comes in today for follow-up left total hip arthroplasty 07/11/2023.  She states the hip overall is doing well.  Her only complaint is she has tightness soreness in both thighs she describes a stocking-like sensation over the anterior aspect of both thighs.  She also has some soreness within the right hip groin area that walks out quickly.  Notes soreness lateral aspect of the right hip.  No numbness tingling down either leg.  She has pain anterior aspect of her right knee.   History of right knee arthroplasty done by another surgeon.  She states that she has had pes injection in the past which only gave her relief for couple of days.  She denies any back pain.  She denies any pain while lying on the right side.  States she is able to stretch and that the pain in the groin area goes away.  The stocking light sensation over the thighs also improves with stretching.  Notes the anterior hip pain has been present for the past 2 to 3 months no known injury.  She also notes that her balance is not as good as it used to be feels that this is became worse over the last few months.  Denies any saddle anesthesia like symptoms, bowel bladder dysfunction, waking back pain fevers or chills.  Review of Systems See HPI  Objective: Vital Signs: There were no vitals taken for this visit.  Physical Exam Constitutional:      Appearance: She is not ill-appearing or diaphoretic.  Cardiovascular:     Pulses: Normal pulses.  Pulmonary:     Effort: Pulmonary effort is normal.  Neurological:     Mental Status: She is alert and oriented to person, place, and time.  Psychiatric:  Mood and Affect: Mood normal.   Bilateral hips good range of motion without pain.  Tenderness over the right hip trochanteric region down the IT band.  Surgical incisions bilaterally from total hip arthroplasties are well-healed without any signs of infection. Right knee: Good range of motion right knee.  No abnormal warmth erythema or effusion.  Tenderness over the pes anserinus region. Lumbar spine: Negative straight leg raise bilaterally.  She lacks approximately 4 to 6 inches of being able to touch her toes with forward flexion.  Limited extension lumbar spine.  Nontender over the spinal column with palpation nontender paraspinous region bilaterally lumbar spine.  Tight hamstrings bilaterally.  5-5 strength throughout the lower extremities against resistance.  Able to walk on tiptoes and heels.  Tandem gait  feels unsteadiness and is difficult for patient to perform.    Specialty Comments:  No specialty comments available.  Imaging: XR Pelvis 1-2 Views Result Date: 05/12/2024 AP pelvis: Bilateral total hip arthroplasty components well-seated.  Both hips well located.  Slight heterotopic bone formation left hip only.  Otherwise no acute findings or abnormalities.    PMFS History: Patient Active Problem List   Diagnosis Date Noted   Status post total replacement of left hip 07/11/2023   Arthritis of knee, right 04/25/2022   Pain in right knee 04/15/2022   Status post total replacement of right hip 01/18/2022   Sensorineural hearing loss (SNHL), bilateral 07/28/2019   Temporomandibular jaw dysfunction 07/28/2019   Past Medical History:  Diagnosis Date   Anxiety    Arthritis    Cancer (HCC)    melanoma   HDL deficiency    Pre-diabetes    Diet controlled    No family history on file.  Past Surgical History:  Procedure Laterality Date   COLONOSCOPY     EYE SURGERY Bilateral 2018   cateract   KNEE ARTHROPLASTY Right 08/2022   SHOULDER ARTHROSCOPY Right 1997   TONSILLECTOMY     age 78   TOTAL HIP ARTHROPLASTY Right 01/18/2022   Procedure: RIGHT TOTAL HIP ARTHROPLASTY ANTERIOR APPROACH;  Surgeon: Vernetta Lonni GRADE, MD;  Location: [facility];  Service: Orthopedics;  Laterality: Right;   TOTAL HIP ARTHROPLASTY Left 07/11/2023   Procedure: LEFT TOTAL HIP ARTHROPLASTY ANTERIOR APPROACH;  Surgeon: Vernetta Lonni GRADE, MD;  Location: [facility];  Service: Orthopedics;  Laterality: Left;   Social History   Occupational History   Not on file  Tobacco Use   Smoking status: Never   Smokeless tobacco: Never  Vaping Use   Vaping status: Never Used  Substance and Sexual Activity   Alcohol use: Yes    Alcohol/week: 14.0 standard drinks of alcohol    Types: 14 Glasses of wine per week   Drug use: Never   Sexual activity: Not on file

## 2024-05-23 ENCOUNTER — Other Ambulatory Visit (HOSPITAL_BASED_OUTPATIENT_CLINIC_OR_DEPARTMENT_OTHER): Payer: Self-pay

## 2024-05-24 ENCOUNTER — Other Ambulatory Visit (HOSPITAL_BASED_OUTPATIENT_CLINIC_OR_DEPARTMENT_OTHER): Payer: Self-pay

## 2024-05-24 ENCOUNTER — Ambulatory Visit: Admitting: Orthopaedic Surgery

## 2024-05-24 MED ORDER — ALENDRONATE SODIUM 70 MG PO TABS
70.0000 mg | ORAL_TABLET | ORAL | 11 refills | Status: AC
  Filled 2024-05-24: qty 4, 28d supply, fill #0
  Filled 2024-06-27: qty 4, 28d supply, fill #1
  Filled 2024-07-21: qty 4, 28d supply, fill #2
  Filled 2024-08-21: qty 4, 28d supply, fill #3
  Filled 2024-09-17: qty 4, 28d supply, fill #4

## 2024-06-07 ENCOUNTER — Other Ambulatory Visit (HOSPITAL_BASED_OUTPATIENT_CLINIC_OR_DEPARTMENT_OTHER): Payer: Self-pay

## 2024-06-07 MED ORDER — COMIRNATY 30 MCG/0.3ML IM SUSY
0.3000 mL | PREFILLED_SYRINGE | Freq: Once | INTRAMUSCULAR | 0 refills | Status: AC
  Filled 2024-06-07: qty 0.3, 1d supply, fill #0

## 2024-06-18 ENCOUNTER — Other Ambulatory Visit (HOSPITAL_BASED_OUTPATIENT_CLINIC_OR_DEPARTMENT_OTHER): Payer: Self-pay

## 2024-06-23 ENCOUNTER — Other Ambulatory Visit: Payer: Self-pay

## 2024-06-23 ENCOUNTER — Ambulatory Visit: Admitting: Orthopaedic Surgery

## 2024-06-23 ENCOUNTER — Other Ambulatory Visit (HOSPITAL_BASED_OUTPATIENT_CLINIC_OR_DEPARTMENT_OTHER): Payer: Self-pay

## 2024-06-23 ENCOUNTER — Encounter: Payer: Self-pay | Admitting: Orthopaedic Surgery

## 2024-06-23 DIAGNOSIS — Z7409 Other reduced mobility: Secondary | ICD-10-CM | POA: Diagnosis not present

## 2024-06-23 DIAGNOSIS — Z96642 Presence of left artificial hip joint: Secondary | ICD-10-CM | POA: Diagnosis not present

## 2024-06-23 DIAGNOSIS — M7631 Iliotibial band syndrome, right leg: Secondary | ICD-10-CM

## 2024-06-23 MED ORDER — TIZANIDINE HCL 2 MG PO TABS
2.0000 mg | ORAL_TABLET | Freq: Two times a day (BID) | ORAL | 1 refills | Status: AC | PRN
  Filled 2024-06-23: qty 30, 15d supply, fill #0

## 2024-06-23 NOTE — Progress Notes (Signed)
 The patient is someone who we have replaced both of her hips.  The left 1 was the most recent one a year ago and the right one was 2 years ago.  She comes in at 34 after having recent physical therapy to really work on her IT band as well as pes bursitis on the right side and overall quite tightness in weakness on both sides.  She is walking without any assistive device.  She has 3 more therapy sessions.  She has tried ice more than heat and it is more helpful for her.  She does have some tightness of the quads on both sides with some quad weakness as well but both hips are moving smoothly and fluidly in both knees and moving.  She does have less pain over her right IT band as a result of therapy making progress with her.  This point she will continue physical therapy and I will send in a very mild muscle relaxant because most of her pain and discomfort is at night when she is in bed may be some Zanaflex could help with this.  She will try this as needed.  Follow-up can now be as needed with us  unless things are worsening.  All questions and concerns were addressed and answered.

## 2024-07-03 ENCOUNTER — Other Ambulatory Visit (HOSPITAL_BASED_OUTPATIENT_CLINIC_OR_DEPARTMENT_OTHER): Payer: Self-pay

## 2024-07-03 MED ORDER — FLUZONE HIGH-DOSE 0.5 ML IM SUSY
0.5000 mL | PREFILLED_SYRINGE | Freq: Once | INTRAMUSCULAR | 0 refills | Status: AC
  Filled 2024-07-03: qty 0.5, 1d supply, fill #0

## 2024-07-08 ENCOUNTER — Other Ambulatory Visit (HOSPITAL_BASED_OUTPATIENT_CLINIC_OR_DEPARTMENT_OTHER): Payer: Self-pay

## 2024-07-19 ENCOUNTER — Encounter: Payer: Self-pay | Admitting: Radiology

## 2024-07-21 ENCOUNTER — Other Ambulatory Visit (HOSPITAL_BASED_OUTPATIENT_CLINIC_OR_DEPARTMENT_OTHER): Payer: Self-pay

## 2024-07-23 ENCOUNTER — Other Ambulatory Visit (HOSPITAL_COMMUNITY): Payer: Self-pay

## 2024-08-05 ENCOUNTER — Other Ambulatory Visit (HOSPITAL_BASED_OUTPATIENT_CLINIC_OR_DEPARTMENT_OTHER): Payer: Self-pay

## 2024-08-05 MED ORDER — AMOXICILLIN-POT CLAVULANATE 875-125 MG PO TABS
1.0000 | ORAL_TABLET | Freq: Two times a day (BID) | ORAL | 0 refills | Status: AC
  Filled 2024-08-05: qty 20, 10d supply, fill #0

## 2024-08-16 NOTE — Progress Notes (Signed)
 Nichole [name]                                          MRN: [id number]   08/16/2024   The [facility] Quality Team Specialist reviewed this patient medical record for the purposes of chart review for care gap closure. The following were reviewed: chart review for care gap closure-kidney health evaluation for diabetes:eGFR  and uACR.    [facility] Quality Team

## 2024-08-23 ENCOUNTER — Other Ambulatory Visit (HOSPITAL_BASED_OUTPATIENT_CLINIC_OR_DEPARTMENT_OTHER): Payer: Self-pay

## 2024-08-23 MED ORDER — LORAZEPAM 0.5 MG PO TABS
0.5000 mg | ORAL_TABLET | Freq: Every day | ORAL | 0 refills | Status: AC | PRN
  Filled 2024-08-23: qty 10, 10d supply, fill #0

## 2024-09-01 ENCOUNTER — Other Ambulatory Visit (HOSPITAL_COMMUNITY): Payer: Self-pay

## 2024-09-23 ENCOUNTER — Other Ambulatory Visit (HOSPITAL_BASED_OUTPATIENT_CLINIC_OR_DEPARTMENT_OTHER): Payer: Self-pay
